# Patient Record
Sex: Female | Born: 1935 | Race: Black or African American | Hispanic: No | Smoking: Former smoker
Health system: Southern US, Community
[De-identification: ages and names within clinical notes are randomized; demographics above are authoritative.]

## PROBLEM LIST (undated history)

## (undated) DIAGNOSIS — I10 Essential (primary) hypertension: Secondary | ICD-10-CM

## (undated) DIAGNOSIS — F0390 Unspecified dementia without behavioral disturbance: Secondary | ICD-10-CM

## (undated) DIAGNOSIS — R011 Cardiac murmur, unspecified: Secondary | ICD-10-CM

---

## 1999-04-29 ENCOUNTER — Encounter: Payer: Self-pay | Admitting: Family Medicine

## 1999-04-29 ENCOUNTER — Encounter: Admission: RE | Admit: 1999-04-29 | Discharge: 1999-04-29 | Payer: Self-pay | Admitting: Family Medicine

## 2001-01-23 ENCOUNTER — Other Ambulatory Visit: Admission: RE | Admit: 2001-01-23 | Discharge: 2001-01-23 | Payer: Self-pay | Admitting: Family Medicine

## 2001-01-26 ENCOUNTER — Encounter: Payer: Self-pay | Admitting: Emergency Medicine

## 2001-01-26 ENCOUNTER — Inpatient Hospital Stay (HOSPITAL_COMMUNITY): Admission: EM | Admit: 2001-01-26 | Discharge: 2001-01-26 | Payer: Self-pay | Admitting: Emergency Medicine

## 2001-07-26 ENCOUNTER — Encounter: Payer: Self-pay | Admitting: Family Medicine

## 2001-07-26 ENCOUNTER — Encounter: Admission: RE | Admit: 2001-07-26 | Discharge: 2001-07-26 | Payer: Self-pay | Admitting: Family Medicine

## 2002-01-30 ENCOUNTER — Other Ambulatory Visit: Admission: RE | Admit: 2002-01-30 | Discharge: 2002-01-30 | Payer: Self-pay | Admitting: Family Medicine

## 2002-07-29 ENCOUNTER — Encounter: Payer: Self-pay | Admitting: Family Medicine

## 2002-07-29 ENCOUNTER — Encounter: Admission: RE | Admit: 2002-07-29 | Discharge: 2002-07-29 | Payer: Self-pay | Admitting: Family Medicine

## 2006-05-16 ENCOUNTER — Emergency Department (HOSPITAL_COMMUNITY): Admission: EM | Admit: 2006-05-16 | Discharge: 2006-05-16 | Payer: Self-pay | Admitting: Family Medicine

## 2007-11-15 ENCOUNTER — Ambulatory Visit: Payer: Self-pay | Admitting: Gastroenterology

## 2007-11-28 ENCOUNTER — Ambulatory Visit: Payer: Self-pay | Admitting: Gastroenterology

## 2010-08-27 NOTE — Op Note (Signed)
[place]  Patient:    Shannon Bright, Shannon Bright Visit Number: [id number] MRN: [id number]          Service Type: MED Location: 1800 1833 01 Attending Physician:  Marlowe Shores Dictated by:   Artist Pais Mina Marble, M.D. Proc. Date: 01/26/01 Admit Date:  01/26/2001 Discharge Date: 01/26/2001                             Operative Report  PREOPERATIVE DIAGNOSIS:  Right hand open fractures, index and ring fingers.  POSTOPERATIVE DIAGNOSIS:  Right hand open fractures, index and ring fingers.  PROCEDURES:  Irrigation debridement open fractures right index finger middle phalanx with pinning with 0.35 K-wire and 0.28 K-wire and also ORIF distal phalanx ring finger right with O.35 K-wire, as well as extensor tendon repair index and ring fingers with 4-0 Mersilene and primary closure with 5-0 nylon.  SURGEON:  Artist Pais. Mina Marble, M.D.  ASSISTANT:  None.  ANESTHESIA:  General.  TOURNIQUET TIME:  One hour 10 minutes.  COMPLICATIONS:  None.  DRAINS:  None.  DESCRIPTION:  Patient was taken to the operating room where after the induction of adequate general anesthesia the right upper extremity was prepped and draped in sterile fashion.  An Esmarch was used to exsanguinate the limb and tourniquet was inflated to 250 mmHg.  At this point in time, open fractures of the middle phalanx of the index finger and the DIP joint distal phalanx of the ring finger were approached.  Lacerations over the fractures were opened proximally and distally and extended.  The fracture sites were irrigated of clot, debris, and nonviable material.  After this was done, the middle phalangeal fracture of the index finger was pinned with two 0.28 K-wires in crossed fashion and the DIP joint over the ring finger was pinned with a single 0.35 K-wire across the DIP joint, thus reducing the large dorsal fragment.  At this point in time, the extensor tendons of both were repaired with  4-0 Mersilene followed by a skin closure with 5-0 nylon.  The patient then had Marcaine injected for postoperative pain control, placed in a sterile dressing with Xeroform, 4x4s, fluffs, and compressive dressing with a volar splint.  Patient tolerated procedure well and went to recovery room in stable fashion.Dictated by:   Artist Pais Mina Marble, M.D. Attending Physician:  Marlowe Shores DD:  01/26/01 TD:  01/27/01 Job: [id number]

## 2010-08-27 NOTE — Consult Note (Signed)
[place]  Patient:    Shannon Bright, Shannon Bright Visit Number: [id number] MRN: [id number]          Service Type: MED Location: 1800 1833 01 Attending Physician:  Marlowe Shores Dictated by:   Artist Pais Mina Marble, M.D. Proc. Date: 01/26/01 Admit Date:  01/26/2001 Discharge Date: 01/26/2001                            Consultation Report  REASON FOR CONSULTATION:  Right hand crush type injury.  HISTORY OF PRESENT ILLNESS:  The patient is a 77 year old white female, right-hand dominant who was injured at work today, sustaining injury to her right hand with open fractures of the index and ring fingers.  She is an otherwise healthy 77 year old, right-hand dominant female.  ALLERGIES:  No known drug allergies.  MEDICATIONS:  She is currently on verapamil for hypertension.  FAMILY HISTORY:  She has a significant family medical history for hypertension and diabetes.  PAST MEDICAL HISTORY:  She has had no recent hospitalizations for surgery.  REVIEW OF SYSTEMS:  Noncontributory.  SOCIAL HISTORY:  Noncontributory.  PHYSICAL EXAMINATION:  GENERAL:  Well-developed, well-nourished female, pleasant, alert, and oriented x 3.  EXTREMITIES:  On examination of her right hand, she has transverse laceration dorsally to the middle phalanx, loss of active extension, and exposed bone consistent with open fracture of the middle phalanx.  She also has a small laceration on the ulnar side at the distal interphalangeal joint of the ring finger with exposed bone consistent with open fracture distal phalanx ring finger.  LABORATORY DATA:  X-rays show the above fractures.  She is otherwise neurovascularly intact.  Medial, radial, and ulnar nerves are functioning.  IMPRESSION:  A 77 year old female with open injuries to her right hand including open fractures to index finger middle phalanx and ring finger distal phalanx.  PLAN:  At this point in time, take her  to the OR for irrigation and debridement and pinning of the above fractures. Dictated by:   Artist Pais Mina Marble, M.D. Attending Physician:  Marlowe Shores DD:  01/26/01 TD:  01/28/01 Job: [id number]

## 2011-01-15 ENCOUNTER — Inpatient Hospital Stay (INDEPENDENT_AMBULATORY_CARE_PROVIDER_SITE_OTHER)
Admission: RE | Admit: 2011-01-15 | Discharge: 2011-01-15 | Disposition: A | Discharge status: Home / Self Care | Payer: Medicare Other | Source: Ambulatory Visit | Attending: Emergency Medicine | Admitting: Emergency Medicine

## 2011-01-15 DIAGNOSIS — M5430 Sciatica, unspecified side: Secondary | ICD-10-CM

## 2012-03-15 ENCOUNTER — Other Ambulatory Visit (HOSPITAL_COMMUNITY): Payer: Self-pay | Admitting: Internal Medicine

## 2012-03-15 DIAGNOSIS — I6529 Occlusion and stenosis of unspecified carotid artery: Secondary | ICD-10-CM

## 2012-04-02 ENCOUNTER — Encounter (HOSPITAL_COMMUNITY): Payer: Medicare Other

## 2012-04-02 ENCOUNTER — Ambulatory Visit (HOSPITAL_COMMUNITY)
Admission: RE | Admit: 2012-04-02 | Discharge: 2012-04-02 | Disposition: A | Discharge status: Home / Self Care | Payer: No Typology Code available for payment source | Source: Ambulatory Visit | Attending: Internal Medicine | Admitting: Internal Medicine

## 2012-04-02 DIAGNOSIS — I6529 Occlusion and stenosis of unspecified carotid artery: Secondary | ICD-10-CM | POA: Insufficient documentation

## 2012-04-03 NOTE — Progress Notes (Signed)
Carotid duplex completed. Pearson Grippe

## 2013-03-11 ENCOUNTER — Encounter (HOSPITAL_COMMUNITY): Payer: Self-pay | Admitting: Emergency Medicine

## 2013-03-11 ENCOUNTER — Emergency Department (HOSPITAL_COMMUNITY)
Admission: EM | Admit: 2013-03-11 | Discharge: 2013-03-11 | Disposition: A | Discharge status: Home / Self Care | Payer: No Typology Code available for payment source | Attending: Emergency Medicine | Admitting: Emergency Medicine

## 2013-03-11 ENCOUNTER — Emergency Department (HOSPITAL_COMMUNITY): Payer: No Typology Code available for payment source

## 2013-03-11 DIAGNOSIS — Z87891 Personal history of nicotine dependence: Secondary | ICD-10-CM | POA: Insufficient documentation

## 2013-03-11 DIAGNOSIS — I10 Essential (primary) hypertension: Secondary | ICD-10-CM | POA: Insufficient documentation

## 2013-03-11 DIAGNOSIS — Z79899 Other long term (current) drug therapy: Secondary | ICD-10-CM | POA: Insufficient documentation

## 2013-03-11 DIAGNOSIS — R002 Palpitations: Secondary | ICD-10-CM

## 2013-03-11 DIAGNOSIS — F0390 Unspecified dementia without behavioral disturbance: Secondary | ICD-10-CM | POA: Insufficient documentation

## 2013-03-11 HISTORY — DX: Essential (primary) hypertension: I10

## 2013-03-11 HISTORY — DX: Unspecified dementia, unspecified severity, without behavioral disturbance, psychotic disturbance, mood disturbance, and anxiety: F03.90

## 2013-03-11 LAB — BASIC METABOLIC PANEL
CO2: 30 mEq/L (ref 19–32)
Chloride: 100 mEq/L (ref 96–112)
Creatinine, Ser: 0.69 mg/dL (ref 0.50–1.10)
GFR calc Af Amer: >90 mL/min (ref >90)
Potassium: 3.2 mEq/L — ABNORMAL LOW (ref 3.5–5.1)

## 2013-03-11 LAB — CBC
MCV: 82 fL (ref 78.0–100.0)
Platelets: 254 10*3/uL (ref 150–400)
RBC: 4.67 MIL/uL (ref 3.87–5.11)
WBC: 4.9 10*3/uL (ref 4.0–10.5)

## 2013-03-11 LAB — POCT I-STAT TROPONIN I: Troponin i, poc: 0 ng/mL (ref 0.00–0.08)

## 2013-03-11 MED ORDER — LORAZEPAM 2 MG/ML IJ SOLN: 1.0000 mg | Freq: Once | INTRAMUSCULAR | Status: DC

## 2013-03-11 MED ORDER — POTASSIUM CHLORIDE CRYS ER 20 MEQ PO TBCR
40.0000 meq | EXTENDED_RELEASE_TABLET | Freq: Once | ORAL | Status: AC
  Administered 2013-03-11: 40 meq via ORAL
  Filled 2013-03-11: qty 2

## 2013-03-11 NOTE — ED Notes (Signed)
Pt is here with fluttering in chest that is new and has a knot feeling to mid abdomen area.  No chest tightness or sob

## 2013-03-11 NOTE — ED Provider Notes (Signed)
CSN: [id number]     Arrival date & time 03/11/13  1001 History   First MD Initiated Contact with Patient 03/11/13 1327     Chief Complaint  Patient presents with  . Palpitations    HPI Patient presents to emergency room with complaints of fluttering sensation in her chest off and on for the last month. Patient has a history of dementia so she has some difficulty remembering the exact details. She thinks the symptoms have been off and on for about a month. The episodes last maybe a few minutes at a time. She has a sensation of her heart beating irregularly and when this occurs she feels a sensation in her mid abdomen of a or tightness.  She has not had any any symptoms in the last 24 hours. She had an episode over this weekend and mentioned it to her daughter and that is why she is here in the emergency room today. She denies any trouble with chest pain or shortness of breath. She has not had any vomiting or diarrhea. She has not had any light headedness or syncope.  Past Medical History  Diagnosis Date  . Hypertension   . Dementia    History reviewed. No pertinent past surgical history. No family history on file. History  Substance Use Topics  . Smoking status: Former Games developer  . Smokeless tobacco: Not on file  . Alcohol Use: No   OB History   Grav Para Term Preterm Abortions TAB SAB Ect Mult Living                 Review of Systems  All other systems reviewed and are negative.    Allergies  Review of patient's allergies indicates no known allergies.  Home Medications   Current Outpatient Rx  Name  Route  Sig  Dispense  Refill  . donepezil (ARICEPT) 10 MG tablet   Oral   Take 10 mg by mouth at bedtime.         . dorzolamide-timolol (COSOPT) 22.3-6.8 MG/ML ophthalmic solution   Left Eye   Place 1 drop into the left eye 2 (two) times daily.         Marland Kitchen latanoprost (XALATAN) 0.005 % ophthalmic solution   Left Eye   Place 1 drop into the left eye at bedtime.          Marland Kitchen losartan-hydrochlorothiazide (HYZAAR) 100-25 MG per tablet   Oral   Take 1 tablet by mouth daily.         . potassium chloride (K-DUR) 10 MEQ tablet   Oral   Take 10 mEq by mouth 2 (two) times daily.         . ranitidine (ZANTAC) 150 MG tablet   Oral   Take 150 mg by mouth 2 (two) times daily.         . simvastatin (ZOCOR) 20 MG tablet   Oral   Take 20 mg by mouth daily.          BP 146/60  Pulse 55  Temp(Src) 97.5 F (36.4 C) (Oral)  Resp 18  Wt 109 lb (49.442 kg)  SpO2 100% Physical Exam  Nursing note and vitals reviewed. Constitutional: She appears well-developed and well-nourished. No distress.  HENT:  Head: Normocephalic and atraumatic.  Right Ear: External ear normal.  Left Ear: External ear normal.  Eyes: Conjunctivae are normal. Right eye exhibits no discharge. Left eye exhibits no discharge. No scleral icterus.  Neck: Neck supple. No tracheal deviation present.  Cardiovascular: Normal rate, regular rhythm and intact distal pulses.   Pulmonary/Chest: Effort normal and breath sounds normal. No stridor. No respiratory distress. She has no wheezes. She has no rales.  Abdominal: Soft. Bowel sounds are normal. She exhibits no distension. There is no tenderness. There is no rebound and no guarding.  Musculoskeletal: She exhibits no edema and no tenderness.  Neurological: She is alert. She has normal strength. No sensory deficit. Cranial nerve deficit:  no gross defecits noted. She exhibits normal muscle tone. She displays no seizure activity. Coordination normal.  Skin: Skin is warm and dry. No rash noted.  Psychiatric: She has a normal mood and affect.    ED Course  Procedures (including critical care time) Labs Review Labs Reviewed  BASIC METABOLIC PANEL - Abnormal; Notable for the following:    Potassium 3.2 (*)    Glucose, Bld 103 (*)    GFR calc non Af Amer 81 (*)    All other components within normal limits  CBC  POCT I-STAT TROPONIN I    Imaging Review Dg Chest 2 View  03/11/2013   CLINICAL DATA:  Palpitation.  EXAM: CHEST  2 VIEW  COMPARISON:  None.  FINDINGS: The heart size and mediastinal contours are within normal limits. The aorta is tortuous. Both lungs are clear. There are degenerative joint changes of the spine.  IMPRESSION: No active cardiopulmonary disease.   Electronically Signed   By: Sherian Rein M.D.   On: 03/11/2013 11:04    EKG Interpretation    Date/Time:  Monday March 11 2013 10:06:49 EST Ventricular Rate:  72 PR Interval:  152 QRS Duration: 90 QT Interval:  414 QTC Calculation: 453 R Axis:   45 Text Interpretation:  Normal sinus rhythm Minimal voltage criteria for LVH, may be normal variant Nonspecific ST and T wave abnormality Abnormal ECG No significant change since last tracing Confirmed by Chaston Bradburn  MD-J, Jaskirat Schwieger ([id number]) on 03/11/2013 1:27:25 PM           Medications  potassium chloride SA (K-DUR,KLOR-CON) CR tablet 40 mEq (not administered)    MDM   1. Palpitations    Normal heart rhythm in the ED.  No ectopy noted on the monitor.  Mild hypokalemia.  Oral dose given in the ED.  Follow up with pcp to discuss possible holter monitoring.  At this time there does not appear to be any evidence of an acute emergency medical condition and the patient appears stable for discharge with appropriate outpatient follow up.     Celene Kras, MD 03/11/13 [phone]

## 2013-07-03 ENCOUNTER — Other Ambulatory Visit: Payer: Self-pay | Admitting: Internal Medicine

## 2013-07-03 ENCOUNTER — Ambulatory Visit
Admission: RE | Admit: 2013-07-03 | Discharge: 2013-07-03 | Disposition: A | Discharge status: Home / Self Care | Payer: Commercial Managed Care - HMO | Source: Ambulatory Visit | Attending: Internal Medicine | Admitting: Internal Medicine

## 2013-07-03 DIAGNOSIS — M25579 Pain in unspecified ankle and joints of unspecified foot: Secondary | ICD-10-CM

## 2013-10-09 ENCOUNTER — Other Ambulatory Visit: Payer: Self-pay | Admitting: Internal Medicine

## 2013-10-09 DIAGNOSIS — E2839 Other primary ovarian failure: Secondary | ICD-10-CM

## 2013-10-10 ENCOUNTER — Ambulatory Visit
Admission: RE | Admit: 2013-10-10 | Discharge: 2013-10-10 | Disposition: A | Discharge status: Home / Self Care | Payer: Commercial Managed Care - HMO | Source: Ambulatory Visit | Attending: Internal Medicine | Admitting: Internal Medicine

## 2013-10-10 DIAGNOSIS — E2839 Other primary ovarian failure: Secondary | ICD-10-CM

## 2014-10-20 ENCOUNTER — Encounter: Payer: Self-pay | Admitting: Gastroenterology

## 2016-11-11 ENCOUNTER — Encounter (HOSPITAL_COMMUNITY): Payer: Self-pay | Admitting: *Deleted

## 2016-11-11 ENCOUNTER — Emergency Department (HOSPITAL_COMMUNITY)
Admission: EM | Admit: 2016-11-11 | Discharge: 2016-11-11 | Disposition: A | Discharge status: Home / Self Care | Payer: Medicare (Managed Care) | Attending: Emergency Medicine | Admitting: Emergency Medicine

## 2016-11-11 DIAGNOSIS — Z79899 Other long term (current) drug therapy: Secondary | ICD-10-CM | POA: Insufficient documentation

## 2016-11-11 DIAGNOSIS — Z7982 Long term (current) use of aspirin: Secondary | ICD-10-CM | POA: Diagnosis not present

## 2016-11-11 DIAGNOSIS — Z87891 Personal history of nicotine dependence: Secondary | ICD-10-CM | POA: Insufficient documentation

## 2016-11-11 DIAGNOSIS — I10 Essential (primary) hypertension: Secondary | ICD-10-CM | POA: Diagnosis not present

## 2016-11-11 DIAGNOSIS — R410 Disorientation, unspecified: Secondary | ICD-10-CM | POA: Diagnosis not present

## 2016-11-11 DIAGNOSIS — R4182 Altered mental status, unspecified: Secondary | ICD-10-CM | POA: Diagnosis present

## 2016-11-11 LAB — I-STAT CHEM 8, ED
BUN: 16 mg/dL (ref 6–20)
CALCIUM ION: 1.14 mmol/L — AB (ref 1.15–1.40)
CHLORIDE: 101 mmol/L (ref 101–111)
CREATININE: 1 mg/dL (ref 0.44–1.00)
GLUCOSE: 181 mg/dL — AB (ref 65–99)
HCT: 37 % (ref 36.0–46.0)
Hemoglobin: 12.6 g/dL (ref 12.0–15.0)
Potassium: 3.1 mmol/L — ABNORMAL LOW (ref 3.5–5.1)
Sodium: 142 mmol/L (ref 135–145)
TCO2: 28 mmol/L (ref 0–100)

## 2016-11-11 MED ORDER — POTASSIUM CHLORIDE CRYS ER 20 MEQ PO TBCR
40.0000 meq | EXTENDED_RELEASE_TABLET | Freq: Once | ORAL | Status: AC
  Administered 2016-11-11: 40 meq via ORAL
  Filled 2016-11-11: qty 2

## 2016-11-11 NOTE — ED Provider Notes (Signed)
[facility]-EMERGENCY DEPT Provider Note   CSN: [id number] Arrival date & time: 11/11/16  1501     History   Chief Complaint Chief Complaint  Patient presents with  . Altered Mental Status    HPI Shannon Bright is a 83 y.o. female.  Patient brought over from adult day care because she was more confused than normal and had low blood pressure. On my initial evaluation her daughter states that she is her normal self.   The history is provided by a relative. No language interpreter was used.  Altered Mental Status   This is a recurrent problem. The current episode started more than 1 week ago. The problem has not changed since onset.Pertinent negatives include no agitation and no self-injury. Risk factors: History of dementia. Her past medical history does not include seizures.    Past Medical History:  Diagnosis Date  . Dementia   . Hypertension     There are no active problems to display for this patient.   History reviewed. No pertinent surgical history.  OB History    No data available       Home Medications    Prior to Admission medications   Medication Sig Start Date End Date Taking? Authorizing Provider  acetaminophen (TYLENOL) 325 MG tablet Take 650 mg by mouth every 6 (six) hours as needed for mild pain.   Yes [provider]  amLODipine (NORVASC) 10 MG tablet Take 10 mg by mouth daily.   Yes [provider]  aspirin EC 81 MG tablet Take 81 mg by mouth daily.   Yes [provider]  ciprofloxacin (CIPRO) 250 MG tablet Take 250 mg by mouth 2 (two) times daily. For 7 days; started on 11-09-16   Yes [provider]  donepezil (ARICEPT) 10 MG tablet Take 10 mg by mouth at bedtime.   Yes [provider]  dorzolamide-timolol (COSOPT) 22.3-6.8 MG/ML ophthalmic solution Place 1 drop into the left eye 2 (two) times daily.   Yes [provider]  latanoprost (XALATAN) 0.005 % ophthalmic solution Place 1 drop into the left eye  at bedtime.   Yes [provider]  losartan-hydrochlorothiazide (HYZAAR) 100-12.5 MG tablet Take 1 tablet by mouth daily.   Yes [provider]  Melatonin 1 MG TABS Take 1 mg by mouth at bedtime.   Yes [provider]  memantine (NAMENDA) 10 MG tablet Take 10 mg by mouth 2 (two) times daily.   Yes [provider]  potassium chloride (K-DUR) 10 MEQ tablet Take 20 mEq by mouth daily.    Yes [provider]  ranitidine (ZANTAC) 150 MG tablet Take 150 mg by mouth 2 (two) times daily.   Yes [provider]  risperiDONE (RISPERDAL) 0.25 MG tablet Take 0.25 mg by mouth at bedtime.   Yes [provider]  simvastatin (ZOCOR) 20 MG tablet Take 20 mg by mouth daily.   Yes [provider]  losartan-hydrochlorothiazide (HYZAAR) 100-25 MG per tablet Take 1 tablet by mouth daily.    [provider]    Family History No family history on file.  Social History Social History  Substance Use Topics  . Smoking status: Former Games developermoker  . Smokeless tobacco: Never Used  . Alcohol use No     Allergies   Patient has no known allergies.   Review of Systems Review of Systems  Unable to perform ROS: Dementia  Psychiatric/Behavioral: Negative for agitation and self-injury.     Physical Exam Updated Vital  Signs BP 120/87   Pulse (!) 59   Temp (!) 97.1 F (36.2 C) (Axillary)   Resp 15   SpO2 100%   Physical Exam  Constitutional: She appears well-developed.  HENT:  Head: Normocephalic.  Eyes: Conjunctivae and EOM are normal. No scleral icterus.  Neck: Neck supple. No thyromegaly present.  Cardiovascular: Normal rate and regular rhythm.  Exam reveals no gallop and no friction rub.   No murmur heard. Pulmonary/Chest: No stridor. She has no wheezes. She has no rales. She exhibits no tenderness.  Abdominal: She exhibits no distension. There is no tenderness. There is no rebound.  Musculoskeletal: Normal range of motion.  She exhibits no edema.  Lymphadenopathy:    She has no cervical adenopathy.  Neurological: She is alert. She exhibits normal muscle tone. Coordination normal.  Oriented to person only  Skin: No rash noted. No erythema.  Psychiatric: She has a normal mood and affect. Her behavior is normal.     ED Treatments / Results  Labs (all labs ordered are listed, but only abnormal results are displayed) Labs Reviewed  I-STAT CHEM 8, ED - Abnormal; Notable for the following:       Result Value   Potassium 3.1 (*)    Glucose, Bld 181 (*)    Calcium, Ion 1.14 (*)    All other components within normal limits    EKG  EKG Interpretation None       Radiology No results found.  Procedures Procedures (including critical care time)  Medications Ordered in ED Medications  potassium chloride SA (K-DUR,KLOR-CON) CR tablet 40 mEq (not administered)     Initial Impression / Assessment and Plan / ED Course  I have reviewed the triage vital signs and the nursing notes.  Pertinent labs & imaging results that were available during my care of the patient were reviewed by me and considered in my medical decision making (see chart for details).     Patient's blood pressure has been normal in the emergency department. Her daughter states that she is at her baseline. Patient will be discharged home and follow-up with her doctor  Final Clinical Impressions(s) / ED Diagnoses   Final diagnoses:  Confusion    New Prescriptions New Prescriptions   No medications on file     Bethann BerkshireZammit, Rodman Recupero, MD 11/11/16 [phone]

## 2016-11-11 NOTE — ED Notes (Signed)
Pt's daughter reports pt was doing fine this am, ate her breakfast and did not have any complaints.

## 2016-11-11 NOTE — Discharge Instructions (Signed)
Follow-up with your doctor if any problems 

## 2016-11-11 NOTE — ED Triage Notes (Signed)
Per EMS, pt transported from [facility] today d/t AMS with hypotension and bradycardia.  Has advanced dementia.  Pt alert to self.  Pt is c/o L shoulder pain.  Staff at [facility] reports pt was more lethargic today, her BP was 80/50 and pulse was 56.

## 2016-12-13 ENCOUNTER — Ambulatory Visit (INDEPENDENT_AMBULATORY_CARE_PROVIDER_SITE_OTHER): Payer: Medicare (Managed Care) | Admitting: Orthopedic Surgery

## 2016-12-13 ENCOUNTER — Inpatient Hospital Stay (HOSPITAL_COMMUNITY)
Admission: EM | Admit: 2016-12-13 | Discharge: 2016-12-19 | DRG: 470 | Disposition: A | Discharge status: Skilled Nursing Facility | Payer: Medicare (Managed Care) | Attending: Internal Medicine | Admitting: Internal Medicine

## 2016-12-13 ENCOUNTER — Emergency Department (HOSPITAL_COMMUNITY): Payer: Medicare (Managed Care)

## 2016-12-13 ENCOUNTER — Other Ambulatory Visit: Payer: Self-pay | Admitting: Nurse Practitioner

## 2016-12-13 ENCOUNTER — Encounter (INDEPENDENT_AMBULATORY_CARE_PROVIDER_SITE_OTHER): Payer: Self-pay | Admitting: Orthopedic Surgery

## 2016-12-13 ENCOUNTER — Encounter (HOSPITAL_COMMUNITY): Payer: Self-pay | Admitting: Emergency Medicine

## 2016-12-13 ENCOUNTER — Ambulatory Visit
Admission: RE | Admit: 2016-12-13 | Discharge: 2016-12-13 | Disposition: A | Discharge status: Home / Self Care | Payer: Medicare (Managed Care) | Source: Ambulatory Visit | Attending: Nurse Practitioner | Admitting: Nurse Practitioner

## 2016-12-13 DIAGNOSIS — D62 Acute posthemorrhagic anemia: Secondary | ICD-10-CM | POA: Diagnosis not present

## 2016-12-13 DIAGNOSIS — Z7982 Long term (current) use of aspirin: Secondary | ICD-10-CM | POA: Diagnosis not present

## 2016-12-13 DIAGNOSIS — F0390 Unspecified dementia without behavioral disturbance: Secondary | ICD-10-CM | POA: Diagnosis present

## 2016-12-13 DIAGNOSIS — Z9181 History of falling: Secondary | ICD-10-CM | POA: Diagnosis not present

## 2016-12-13 DIAGNOSIS — S72001A Fracture of unspecified part of neck of right femur, initial encounter for closed fracture: Secondary | ICD-10-CM | POA: Diagnosis present

## 2016-12-13 DIAGNOSIS — M80051A Age-related osteoporosis with current pathological fracture, right femur, initial encounter for fracture: Secondary | ICD-10-CM | POA: Diagnosis present

## 2016-12-13 DIAGNOSIS — H409 Unspecified glaucoma: Secondary | ICD-10-CM | POA: Diagnosis present

## 2016-12-13 DIAGNOSIS — M25551 Pain in right hip: Secondary | ICD-10-CM

## 2016-12-13 DIAGNOSIS — Z419 Encounter for procedure for purposes other than remedying health state, unspecified: Secondary | ICD-10-CM

## 2016-12-13 DIAGNOSIS — Z96649 Presence of unspecified artificial hip joint: Secondary | ICD-10-CM

## 2016-12-13 DIAGNOSIS — M80051D Age-related osteoporosis with current pathological fracture, right femur, subsequent encounter for fracture with routine healing: Secondary | ICD-10-CM | POA: Diagnosis not present

## 2016-12-13 DIAGNOSIS — E876 Hypokalemia: Secondary | ICD-10-CM | POA: Diagnosis present

## 2016-12-13 DIAGNOSIS — G301 Alzheimer's disease with late onset: Secondary | ICD-10-CM | POA: Diagnosis not present

## 2016-12-13 DIAGNOSIS — I10 Essential (primary) hypertension: Secondary | ICD-10-CM | POA: Diagnosis present

## 2016-12-13 DIAGNOSIS — S72009A Fracture of unspecified part of neck of unspecified femur, initial encounter for closed fracture: Secondary | ICD-10-CM | POA: Diagnosis present

## 2016-12-13 DIAGNOSIS — Z79899 Other long term (current) drug therapy: Secondary | ICD-10-CM | POA: Diagnosis not present

## 2016-12-13 DIAGNOSIS — Z96641 Presence of right artificial hip joint: Secondary | ICD-10-CM | POA: Diagnosis not present

## 2016-12-13 DIAGNOSIS — F0281 Dementia in other diseases classified elsewhere with behavioral disturbance: Secondary | ICD-10-CM | POA: Diagnosis not present

## 2016-12-13 DIAGNOSIS — Z87891 Personal history of nicotine dependence: Secondary | ICD-10-CM | POA: Diagnosis not present

## 2016-12-13 DIAGNOSIS — F02818 Dementia in other diseases classified elsewhere, unspecified severity, with other behavioral disturbance: Secondary | ICD-10-CM

## 2016-12-13 DIAGNOSIS — W010XXA Fall on same level from slipping, tripping and stumbling without subsequent striking against object, initial encounter: Secondary | ICD-10-CM | POA: Diagnosis present

## 2016-12-13 DIAGNOSIS — M810 Age-related osteoporosis without current pathological fracture: Secondary | ICD-10-CM | POA: Diagnosis present

## 2016-12-13 DIAGNOSIS — I350 Nonrheumatic aortic (valve) stenosis: Secondary | ICD-10-CM | POA: Diagnosis present

## 2016-12-13 DIAGNOSIS — R011 Cardiac murmur, unspecified: Secondary | ICD-10-CM | POA: Diagnosis not present

## 2016-12-13 HISTORY — DX: Cardiac murmur, unspecified: R01.1

## 2016-12-13 LAB — COMPREHENSIVE METABOLIC PANEL
ALBUMIN: 3.7 g/dL (ref 3.5–5.0)
ALK PHOS: 45 U/L (ref 38–126)
ALT: 15 U/L (ref 14–54)
AST: 17 U/L (ref 15–41)
Anion gap: 11 (ref 5–15)
BILIRUBIN TOTAL: 0.7 mg/dL (ref 0.3–1.2)
BUN: 18 mg/dL (ref 6–20)
CALCIUM: 9.6 mg/dL (ref 8.9–10.3)
CO2: 26 mmol/L (ref 22–32)
Chloride: 105 mmol/L (ref 101–111)
Creatinine, Ser: 0.86 mg/dL (ref 0.44–1.00)
GFR calc Af Amer: >60 mL/min (ref >60)
GFR calc non Af Amer: >60 mL/min (ref >60)
GLUCOSE: 137 mg/dL — AB (ref 65–99)
Potassium: 3.4 mmol/L — ABNORMAL LOW (ref 3.5–5.1)
Sodium: 142 mmol/L (ref 135–145)
TOTAL PROTEIN: 7.4 g/dL (ref 6.5–8.1)

## 2016-12-13 LAB — URINALYSIS, ROUTINE W REFLEX MICROSCOPIC
BACTERIA UA: NONE SEEN
BILIRUBIN URINE: NEGATIVE
Glucose, UA: 150 mg/dL — AB
Hgb urine dipstick: NEGATIVE
KETONES UR: NEGATIVE mg/dL
Nitrite: NEGATIVE
PROTEIN: 30 mg/dL — AB
Specific Gravity, Urine: 1.019 (ref 1.005–1.030)
pH: 5 (ref 5.0–8.0)

## 2016-12-13 LAB — CBC WITH DIFFERENTIAL/PLATELET
BASOS ABS: 0 10*3/uL (ref 0.0–0.1)
BASOS PCT: 0 %
Eosinophils Absolute: 0.1 10*3/uL (ref 0.0–0.7)
Eosinophils Relative: 1 %
HEMATOCRIT: 34.8 % — AB (ref 36.0–46.0)
HEMOGLOBIN: 11.1 g/dL — AB (ref 12.0–15.0)
Lymphocytes Relative: 10 %
Lymphs Abs: 1.1 10*3/uL (ref 0.7–4.0)
MCH: 24.7 pg — ABNORMAL LOW (ref 26.0–34.0)
MCHC: 31.9 g/dL (ref 30.0–36.0)
MCV: 77.5 fL — ABNORMAL LOW (ref 78.0–100.0)
Monocytes Absolute: 0.8 10*3/uL (ref 0.1–1.0)
Monocytes Relative: 8 %
NEUTROS ABS: 8.5 10*3/uL — AB (ref 1.7–7.7)
NEUTROS PCT: 81 %
Platelets: 279 10*3/uL (ref 150–400)
RBC: 4.49 MIL/uL (ref 3.87–5.11)
RDW: 15.6 % — ABNORMAL HIGH (ref 11.5–15.5)
WBC: 10.4 10*3/uL (ref 4.0–10.5)

## 2016-12-13 MED ORDER — SENNOSIDES-DOCUSATE SODIUM 8.6-50 MG PO TABS
1.0000 | ORAL_TABLET | Freq: Every evening | ORAL | Status: DC | PRN
  Administered 2016-12-17: 1 via ORAL
  Filled 2016-12-13: qty 1

## 2016-12-13 MED ORDER — LOSARTAN POTASSIUM 50 MG PO TABS
100.0000 mg | ORAL_TABLET | Freq: Every day | ORAL | Status: DC
  Administered 2016-12-14 – 2016-12-19 (×5): 100 mg via ORAL
  Filled 2016-12-13 (×6): qty 2

## 2016-12-13 MED ORDER — MEMANTINE HCL 10 MG PO TABS
10.0000 mg | ORAL_TABLET | Freq: Two times a day (BID) | ORAL | Status: DC
  Administered 2016-12-13 – 2016-12-19 (×11): 10 mg via ORAL
  Filled 2016-12-13 (×12): qty 1

## 2016-12-13 MED ORDER — POTASSIUM CHLORIDE CRYS ER 20 MEQ PO TBCR
20.0000 meq | EXTENDED_RELEASE_TABLET | Freq: Every day | ORAL | Status: DC
  Administered 2016-12-14 – 2016-12-16 (×2): 20 meq via ORAL
  Filled 2016-12-13: qty 2
  Filled 2016-12-13 (×3): qty 1

## 2016-12-13 MED ORDER — LOSARTAN POTASSIUM-HCTZ 100-12.5 MG PO TABS: 1.0000 | ORAL_TABLET | Freq: Every day | ORAL | Status: DC

## 2016-12-13 MED ORDER — DORZOLAMIDE HCL-TIMOLOL MAL 2-0.5 % OP SOLN
1.0000 [drp] | Freq: Two times a day (BID) | OPHTHALMIC | Status: DC
  Administered 2016-12-13 – 2016-12-19 (×11): 1 [drp] via OPHTHALMIC
  Filled 2016-12-13: qty 10

## 2016-12-13 MED ORDER — LATANOPROST 0.005 % OP SOLN
1.0000 [drp] | Freq: Every day | OPHTHALMIC | Status: DC
  Administered 2016-12-13 – 2016-12-18 (×6): 1 [drp] via OPHTHALMIC
  Filled 2016-12-13: qty 2.5

## 2016-12-13 MED ORDER — DONEPEZIL HCL 10 MG PO TABS
10.0000 mg | ORAL_TABLET | Freq: Every day | ORAL | Status: DC
  Administered 2016-12-13 – 2016-12-18 (×6): 10 mg via ORAL
  Filled 2016-12-13 (×6): qty 1

## 2016-12-13 MED ORDER — SIMVASTATIN 20 MG PO TABS
20.0000 mg | ORAL_TABLET | Freq: Every day | ORAL | Status: DC
  Administered 2016-12-14 – 2016-12-19 (×5): 20 mg via ORAL
  Filled 2016-12-13 (×6): qty 1

## 2016-12-13 MED ORDER — AMLODIPINE BESYLATE 10 MG PO TABS
10.0000 mg | ORAL_TABLET | Freq: Every day | ORAL | Status: DC
Start: 2016-12-14 — End: 2016-12-19
  Administered 2016-12-14 – 2016-12-19 (×5): 10 mg via ORAL
  Filled 2016-12-13 (×6): qty 1

## 2016-12-13 MED ORDER — RISPERIDONE 0.5 MG PO TABS
0.2500 mg | ORAL_TABLET | Freq: Every day | ORAL | Status: DC
  Administered 2016-12-13 – 2016-12-18 (×6): 0.25 mg via ORAL
  Filled 2016-12-13 (×6): qty 1

## 2016-12-13 MED ORDER — MELATONIN 3 MG PO TABS
3.0000 mg | ORAL_TABLET | Freq: Every day | ORAL | Status: DC
  Administered 2016-12-14 – 2016-12-18 (×5): 3 mg via ORAL
  Filled 2016-12-13 (×6): qty 1

## 2016-12-13 MED ORDER — HYDROCHLOROTHIAZIDE 12.5 MG PO CAPS
12.5000 mg | ORAL_CAPSULE | Freq: Every day | ORAL | Status: DC
  Administered 2016-12-14 – 2016-12-19 (×5): 12.5 mg via ORAL
  Filled 2016-12-13 (×6): qty 1

## 2016-12-13 NOTE — ED Triage Notes (Signed)
Per EMS: Pt tripped and fell last night at home with c/o of right hip pain.  [facility] and was sent here to be evaluated for surgery due to a possible hip Fx.  Pt denies LOC.  [facility] Pt has no pre-existing injury to the right hip.  PTA vitals: 160/90, HR 76, RR 18, Sp02 96 RA.

## 2016-12-13 NOTE — ED Notes (Signed)
XR at bedside

## 2016-12-13 NOTE — Progress Notes (Signed)
I saw the patient in my office today at the request of Dr. Lajoyce Cornersuda.  I will assume the patient's orthopedic care.  Surgery is planned for tomorrow.  NPO after midnight.

## 2016-12-13 NOTE — ED Notes (Signed)
Provider at the bedside.  

## 2016-12-13 NOTE — Progress Notes (Signed)
Present to place PIV.  Pt in transit to floor.  Will follow up

## 2016-12-13 NOTE — ED Notes (Signed)
Per floor 5N: They are not able to accept this pt due to staffing

## 2016-12-13 NOTE — ED Notes (Signed)
Attempted report 

## 2016-12-13 NOTE — Progress Notes (Signed)
   Office Visit Note   Patient: Shannon Bright           Date of Birth: 05/24/1931           MRN: [id number] Visit Date: 12/13/2016              Requested by: No referring provider defined for this encounter. PCP: System, Pcp Not In  Chief Complaint  Patient presents with  . Right Hip - Fracture      HPI: Patient is an 83 year old woman with Alzheimer's dementia who lives at home with family. [facility].  Patient lost her balance last night fell on the right hip. [facility] shows a displaced femoral neck fracture on the right.  Assessment & Plan: Visit Diagnoses:  1. Closed displaced fracture of right femoral neck (HCC)   2. Late onset Alzheimer's disease with behavioral disturbance     Plan: Will have patient admitted to the hospitalist service today plan for surgery tomorrow with Dr. Leigh AuroraX Yoo. Discussed with the patient's family the risks of infection dislocation need for additional surgery. Discussed that with her dementia she may not be able to walk again.  Follow-Up Instructions: Return if symptoms worsen or fail to improve.   Ortho Exam  Patient is not alert, nor oriented, no adenopathy, well-dressed, abnormal affect, normal respiratory effort. Patient does not respond to commands. Patient has a palpable dorsalis pedis pulse. She has pain with attempted internal and external rotation of the right hip. Radiographs shows significant vascular disease with calcification of the femoral vessels arthritic changes of the acetabulum as well as an impacted femoral neck fracture.  Imaging: Dg Hip Unilat With Pelvis 2-3 Views Right  Result Date: 12/13/2016 CLINICAL DATA:  Larey SeatFell 12/12/2016.  Persistent pain. EXAM: DG HIP (WITH OR WITHOUT PELVIS) 2-3V RIGHT COMPARISON:  None. FINDINGS: Impacted subcapital fracture of the right hip. Moderate right hip joint degenerative changes. The visualized right hemipelvis is  intact. The pubic symphysis and SI joints are intact. Extensive vascular calcifications are noted. IMPRESSION: Impacted subcapital fracture of the right hip. Electronically Signed   By: Rudie MeyerP.  Gallerani M.D.   On: 12/13/2016 11:39   No images are attached to the encounter.  Labs: No results found for: HGBA1C, ESRSEDRATE, CRP, LABURIC, REPTSTATUS, GRAMSTAIN, CULT, LABORGA  Orders:  No orders of the defined types were placed in this encounter.  No orders of the defined types were placed in this encounter.    Procedures: No procedures performed  Clinical Data: No additional findings.  ROS:  All other systems negative, except as noted in the HPI. Review of Systems  Objective: Vital Signs: There were no vitals taken for this visit.  Specialty Comments:  No specialty comments available.  PMFS History: Patient Active Problem List   Diagnosis Date Noted  . Closed displaced fracture of right femoral neck (HCC) 12/13/2016   Past Medical History:  Diagnosis Date  . Dementia   . Hypertension     No family history on file.  No past surgical history on file. Social History   Occupational History  . Not on file.   Social History Main Topics  . Smoking status: Former Games developermoker  . Smokeless tobacco: Never Used  . Alcohol use No  . Drug use: No  . Sexual activity: Not on file

## 2016-12-13 NOTE — H&P (Signed)
Date: 12/13/2016               Patient Name:  Shannon Bright MRN: [id number]  DOB: 1932-02-01 Age / Sex: 83 y.o., female   PCP: System, Pcp Not In         Medical Service: Internal Medicine Teaching Service         Attending Physician: Dr. Mikey Bussing, Marthenia Rolling, DO    First Contact: Dr. Anthonette Legato Pager: [phone]  Second Contact: Dr. Nelson Chimes Pager: [phone]       After Hours (After 5p/  First Contact Pager: [phone]  weekends / holidays): Second Contact Pager: [phone]   Chief Complaint: Fall  History of Present Illness: Shannon Bright is an 83 yo F with past medical history of HTN and dementia who presented to the ED from orthopedic clinic after a fall with R hip fracture on XR. Shannon Bright who is Shannon primary caregiver was present and provided the history.  Yesterday, Shannon Bright was changing Shannon briefs in Shannon bedroom when the pt became off balance and fell on to Shannon right side. She had no complaints of light-headedness or palpitations prior to the fall, no LOC. She did not complain of any hip pain following the fall or this morning but the Bright noticed that she was not walking. She did note moving Shannon right lower extremity spontaneously. At baseline she is mobile without a cane or walker, can walk up stairs. She denies a history of prior falls. Denies dysuria but notes Shannon urine smelled strong yesterday, Shannon Bright gave Shannon a dose of Cipro left over from prior prescription. She denies other symptoms including generalized fatigue or weakness, chest pain, shortness of breath, n/v/d, and she has had normal PO intake. She does not perform Shannon ADLs, can feed herself with encouragement, only recognizes Shannon Bright and Shannon participation in conversation is mostly with Shannon Bright per Shannon report.  Today she presented to [facility] with Dr. Lajoyce Corners who obtained R Hip XR. Imaging revealed impacted subcapital fracture of the right hip. She was sent to the hospital with plan for admission and  surgical repair of R hip tomorrow.   In the ED, T 98.2, HR 79, 153/88, 97% on RA. Labs showed Hgb 11.1, U/A negative for infection, otherwise unremarkable. She was admitted for operative repair of Shannon hip fracture.      Meds:  Current Meds  Medication Sig  . acetaminophen (TYLENOL) 325 MG tablet Take 650 mg by mouth every 6 (six) hours as needed for mild pain.  Marland Kitchen amLODipine (NORVASC) 10 MG tablet Take 10 mg by mouth daily.  Marland Kitchen aspirin EC 81 MG tablet Take 81 mg by mouth daily.  . ciprofloxacin (CIPRO) 250 MG tablet Take 250 mg by mouth 2 (two) times daily. For 7 days; started on 11-09-16  . donepezil (ARICEPT) 10 MG tablet Take 10 mg by mouth at bedtime.  . dorzolamide-timolol (COSOPT) 22.3-6.8 MG/ML ophthalmic solution Place 1 drop into the left eye 2 (two) times daily.  Marland Kitchen latanoprost (XALATAN) 0.005 % ophthalmic solution Place 1 drop into the left eye at bedtime.  Marland Kitchen losartan-hydrochlorothiazide (HYZAAR) 100-12.5 MG tablet Take 1 tablet by mouth daily.  . Melatonin 1 MG TABS Take 1 mg by mouth at bedtime.  . memantine (NAMENDA) 10 MG tablet Take 10 mg by mouth 2 (two) times daily.  . potassium chloride (K-DUR) 10 MEQ tablet Take 20 mEq by mouth daily.   . ranitidine (ZANTAC) 150 MG tablet Take  150 mg by mouth 2 (two) times daily.  . risperiDONE (RISPERDAL) 0.25 MG tablet Take 0.25 mg by mouth at bedtime.  . simvastatin (ZOCOR) 20 MG tablet Take 20 mg by mouth daily.     Allergies: Allergies as of 12/13/2016  . (No Known Allergies)   Past Medical History:  Diagnosis Date  . Dementia   . Hypertension     Family History: Family history reviewed and is non-contributory.   Social History:  Social History  Substance Use Topics  . Smoking status: Former Games developermoker  . Smokeless tobacco: Never Used  . Alcohol use No     Review of Systems: A complete ROS was negative except as per HPI.   Physical Exam: Blood pressure (!) 163/63, pulse 76, temperature 98.2 F (36.8 C), temperature  source Axillary, resp. rate 15, SpO2 96 %. Physical Exam  Constitutional:  Elderly female resting in bed, no acute distress   HENT:  Head: Normocephalic and atraumatic.  Mouth/Throat: Oropharynx is clear and moist.  Cardiovascular: Normal rate and regular rhythm.   Systolic murmur   Pulmonary/Chest: Effort normal and breath sounds normal. No stridor. No respiratory distress.  Abdominal: Soft. She exhibits no distension. There is no tenderness.  No suprapubic tenderness   Musculoskeletal: She exhibits no edema.  No obvious deformity over R hip, no tenderness to light palpation   Neurological:  Intermittently responsive, does not follow commands   Skin: Skin is warm and dry.     EKG: personally reviewed my interpretation is sinus rhythm, normal rate.   CXR: personally reviewed my interpretation is no acute abnormalities.   Assessment & Plan by Problem:  R Hip Fracture Pt presenting s/p suspected mechanical fall from orthopedic office with confirmed R impacted subcapital hip fracture with plans in place for surgical repair with Orthopedic surgery tomorrow. Extensive vascular calcifications are noted on imaging as well. Expectant management for recovery/prognosis discussed as an outpatient with orthopedic surgery. She is not currently complaining of pain. --NPO at midnight --Neurovascular checks --SCDs, hold other VTE ppx   --F/u surgery post-op recommendations    Dementia At baseline pt is unable to complete ADLs, is only able to recognize Shannon Bright and is less interactive with other people. Shannon Bright is primary caregiver. She denies a history of sun-downing or delirium but notes she may be confused if she (Bright) is not present.  --Continue home donepezil, risperidone --Delirium precautions   Hypertension Pt with history of hypertension, has not taken medications today. On home regimen of Amlodipine 10 mg, Losartan-HCTZ combination, unsure of dose.  --Restart home  medications, Give hyzaar 100-12.5 mg for now  Glaucoma Stable, on home eye drop regimen, will continue as an inpatient.   Dispo: Admit patient to Inpatient with expected length of stay greater than 2 midnights.  Signed: Ginger CarneHarden, Steffie Waggoner, MD 12/13/2016, 5:44 PM  Pager: [phone]

## 2016-12-13 NOTE — ED Provider Notes (Signed)
[facility]-EMERGENCY DEPT Provider Note   CSN: [id number] Arrival date & time: 12/13/16  1430     History   Chief Complaint Chief Complaint  Patient presents with  . Fall  . Hip Fx   HPI  Shannon Bright is a 83 y.o. female with PMH significant for dementia and HTN who presents with right hip fracture after a mechanical fall yesterday evening. Patient's daughter is at bedside, from whom history was obtained. Patient was walking yesterday and lost her balance, fell on her right hip. No LOC or head trauma. Patient's daughter helped her up that evening and patient went to bed. Daughter states that patient did not complain of any pain, lightheadedness, chest pain, or headaches but was walking with a limp after the fall.  This morning, they went to [facility]. [facility] radiographs showed displaced femoral neck fracture on the right. She was sent to the emergency room by Dr. Lajoyce Corners with plan for surgery tomorrow.  Patient has dementia and is normally able to walk around on her own without a walker. Lives at home with daughter, who helps her with all ADLs and medication administration. Patient is not alert or oriented but denies right hip pain or chest pain, only stating that her hands are cold.  Past Medical History:  Diagnosis Date  . Dementia   . Hypertension     Patient Active Problem List   Diagnosis Date Noted  . Closed displaced fracture of right femoral neck (HCC) 12/13/2016    History reviewed. No pertinent surgical history.  OB History    No data available       Home Medications    Prior to Admission medications   Medication Sig Start Date End Date Taking? Authorizing Provider  acetaminophen (TYLENOL) 325 MG tablet Take 650 mg by mouth every 6 (six) hours as needed for mild pain.    [provider]  amLODipine (NORVASC) 10 MG tablet Take 10 mg by mouth daily.    [provider]  aspirin EC 81 MG tablet Take 81 mg by mouth daily.     [provider]  ciprofloxacin (CIPRO) 250 MG tablet Take 250 mg by mouth 2 (two) times daily. For 7 days; started on 11-09-16    [provider]  donepezil (ARICEPT) 10 MG tablet Take 10 mg by mouth at bedtime.    [provider]  dorzolamide-timolol (COSOPT) 22.3-6.8 MG/ML ophthalmic solution Place 1 drop into the left eye 2 (two) times daily.    [provider]  latanoprost (XALATAN) 0.005 % ophthalmic solution Place 1 drop into the left eye at bedtime.    [provider]  losartan-hydrochlorothiazide (HYZAAR) 100-12.5 MG tablet Take 1 tablet by mouth daily.    [provider]  losartan-hydrochlorothiazide (HYZAAR) 100-25 MG per tablet Take 1 tablet by mouth daily.    [provider]  Melatonin 1 MG TABS Take 1 mg by mouth at bedtime.    [provider]  memantine (NAMENDA) 10 MG tablet Take 10 mg by mouth 2 (two) times daily.    [provider]  potassium chloride (K-DUR) 10 MEQ tablet Take 20 mEq by mouth daily.     [provider]  ranitidine (ZANTAC) 150 MG tablet Take 150 mg by mouth 2 (two) times daily.    [provider]  risperiDONE (RISPERDAL) 0.25 MG tablet Take 0.25 mg by mouth at bedtime.    [provider]  simvastatin (ZOCOR) 20 MG tablet Take 20 mg  by mouth daily.    [provider]    Family History History reviewed. No pertinent family history.  Social History Social History  Substance Use Topics  . Smoking status: Former Games developermoker  . Smokeless tobacco: Never Used  . Alcohol use No     Allergies   Patient has no known allergies.   Review of Systems Review of Systems Patient not alert or oriented; unable to obtain  Physical Exam Updated Vital Signs BP (!) 153/88   Pulse 79   Temp 98.2 F (36.8 C) (Axillary)   Resp 20   SpO2 97%   Physical Exam GEN: Elderly female in NAD; not alert; not oriented; does not follow commands EYES: PERRL. Sclera  anicteric. RESP: Clear to auscultation anteriorly. No wheezes, rales, or rhonchi. No increased work of breathing. CV: Normal rate and regular rhythm. Systolic murmur. No LE edema. ABD: Soft. Non-tender. Non-distended. Normoactive bowel sounds. EXT: No edema. Palpable DP pulses bilaterally. Pain with movement of right leg. NEURO: Unable to obtain due to patient's dementia; not following commands.  ED Treatments / Results  Labs (all labs ordered are listed, but only abnormal results are displayed) Labs Reviewed  CBC WITH DIFFERENTIAL/PLATELET  COMPREHENSIVE METABOLIC PANEL  URINALYSIS, ROUTINE W REFLEX MICROSCOPIC    EKG  EKG Interpretation  Date/Time:  Tuesday December 13 2016 14:36:16 EDT Ventricular Rate:  82 PR Interval:    QRS Duration: 100 QT Interval:  389 QTC Calculation: 455 R Axis:   44 Text Interpretation:  Sinus rhythm Minimal ST depression, inferior leads Confirmed by Benjiman CorePickering, Nathan [phone]([id number]) on 12/13/2016 3:01:12 PM       Radiology Dg Chest Port 1 View  Result Date: 12/13/2016 CLINICAL DATA:  Preop for hip fracture.  Dementia and hypertension. EXAM: PORTABLE CHEST 1 VIEW COMPARISON:  03/11/2013 FINDINGS: Mild to moderate right hemidiaphragm elevation. Patient rotated right. Normal heart size for level of inspiration. No pleural effusion or pneumothorax. Atherosclerosis in the transverse aorta. No congestive failure. Clear lungs. IMPRESSION: Low lung volumes, without acute disease. Aortic Atherosclerosis (ICD10-I70.0). Electronically Signed   By: Jeronimo GreavesKyle  Talbot M.D.   On: 12/13/2016 15:08   Dg Hip Unilat With Pelvis 2-3 Views Right  Result Date: 12/13/2016 CLINICAL DATA:  Larey SeatFell 12/12/2016.  Persistent pain. EXAM: DG HIP (WITH OR WITHOUT PELVIS) 2-3V RIGHT COMPARISON:  None. FINDINGS: Impacted subcapital fracture of the right hip. Moderate right hip joint degenerative changes. The visualized right hemipelvis is intact. The pubic symphysis and SI joints are intact. Extensive  vascular calcifications are noted. IMPRESSION: Impacted subcapital fracture of the right hip. Electronically Signed   By: Rudie MeyerP.  Gallerani M.D.   On: 12/13/2016 11:39    Procedures Procedures (including critical care time)  Medications Ordered in ED Medications - No data to display   Initial Impression / Assessment and Plan / ED Course  I have reviewed the triage vital signs and the nursing notes.  Pertinent labs & imaging results that were available during my care of the patient were reviewed by me and considered in my medical decision making (see chart for details).  Ms. Shannon Bright is an 83yo female with baseline dementia who presents with fracture of right hip after mechanical fall yesterday evening. [facility] her here with plan for surgery tomorrow with Dr. Artist PaisYoo. No other signs of injury or trauma. CBC, CMP, and UA ordered. EKG with sinus rhythm. CXR for preop eval with no acute cardiopulmonary disease but low lung volumes. Patient to be  admitted to IMTS.  Final Clinical Impressions(s) / ED Diagnoses   Final diagnoses:  None  Right hip fracture, after mechanical fall yesterday evening  New Prescriptions New Prescriptions   No medications on file     Scherrie Gerlach, MD 12/13/16 1540    Benjiman Core, MD 12/14/16 [phone]

## 2016-12-14 ENCOUNTER — Encounter (HOSPITAL_COMMUNITY): Payer: Self-pay | Admitting: Certified Registered"

## 2016-12-14 ENCOUNTER — Inpatient Hospital Stay (HOSPITAL_COMMUNITY): Payer: Medicare (Managed Care)

## 2016-12-14 DIAGNOSIS — H409 Unspecified glaucoma: Secondary | ICD-10-CM

## 2016-12-14 DIAGNOSIS — I10 Essential (primary) hypertension: Secondary | ICD-10-CM

## 2016-12-14 DIAGNOSIS — M810 Age-related osteoporosis without current pathological fracture: Secondary | ICD-10-CM | POA: Diagnosis present

## 2016-12-14 DIAGNOSIS — R011 Cardiac murmur, unspecified: Secondary | ICD-10-CM

## 2016-12-14 DIAGNOSIS — M80051A Age-related osteoporosis with current pathological fracture, right femur, initial encounter for fracture: Principal | ICD-10-CM

## 2016-12-14 DIAGNOSIS — F0390 Unspecified dementia without behavioral disturbance: Secondary | ICD-10-CM | POA: Diagnosis present

## 2016-12-14 DIAGNOSIS — I350 Nonrheumatic aortic (valve) stenosis: Secondary | ICD-10-CM

## 2016-12-14 DIAGNOSIS — I70209 Unspecified atherosclerosis of native arteries of extremities, unspecified extremity: Secondary | ICD-10-CM | POA: Insufficient documentation

## 2016-12-14 DIAGNOSIS — I5189 Other ill-defined heart diseases: Secondary | ICD-10-CM | POA: Insufficient documentation

## 2016-12-14 DIAGNOSIS — S72001A Fracture of unspecified part of neck of right femur, initial encounter for closed fracture: Secondary | ICD-10-CM

## 2016-12-14 DIAGNOSIS — Z87891 Personal history of nicotine dependence: Secondary | ICD-10-CM

## 2016-12-14 LAB — GLUCOSE, CAPILLARY: Glucose-Capillary: 98 mg/dL (ref 65–99)

## 2016-12-14 LAB — ECHOCARDIOGRAM COMPLETE
Height: 64 in
Weight: 1744 oz

## 2016-12-14 LAB — SURGICAL PCR SCREEN
MRSA, PCR: NEGATIVE
Staphylococcus aureus: NEGATIVE

## 2016-12-14 MED ORDER — ACETAMINOPHEN 325 MG PO TABS
650.0000 mg | ORAL_TABLET | Freq: Four times a day (QID) | ORAL | Status: DC | PRN
  Administered 2016-12-14 (×2): 650 mg via ORAL
  Filled 2016-12-14 (×2): qty 2

## 2016-12-14 MED ORDER — CEFAZOLIN SODIUM-DEXTROSE 2-4 GM/100ML-% IV SOLN
INTRAVENOUS | Status: AC
  Filled 2016-12-14: qty 100

## 2016-12-14 MED ORDER — PROPOFOL 10 MG/ML IV BOLUS
INTRAVENOUS | Status: AC
Start: 2016-12-14 — End: ?
  Filled 2016-12-14: qty 20

## 2016-12-14 MED ORDER — TRANEXAMIC ACID 1000 MG/10ML IV SOLN
1000.0000 mg | INTRAVENOUS | Status: DC
  Filled 2016-12-14: qty 10

## 2016-12-14 MED ORDER — VANCOMYCIN HCL 10 G IV SOLR: 1500.0000 mg | INTRAVENOUS | Status: DC

## 2016-12-14 MED ORDER — FAMOTIDINE 20 MG PO TABS
20.0000 mg | ORAL_TABLET | Freq: Every day | ORAL | Status: DC
  Administered 2016-12-16 – 2016-12-19 (×4): 20 mg via ORAL
  Filled 2016-12-14 (×5): qty 1

## 2016-12-14 MED ORDER — LACTATED RINGERS IV SOLN
INTRAVENOUS | Status: DC
  Administered 2016-12-15 (×2): via INTRAVENOUS

## 2016-12-14 MED ORDER — VANCOMYCIN HCL IN DEXTROSE 1-5 GM/200ML-% IV SOLN: 1000.0000 mg | INTRAVENOUS | Status: DC

## 2016-12-14 MED ORDER — DEXTROSE 5 % IV SOLN: 3.0000 g | INTRAVENOUS | Status: DC

## 2016-12-14 MED ORDER — FENTANYL CITRATE (PF) 250 MCG/5ML IJ SOLN
INTRAMUSCULAR | Status: AC
  Filled 2016-12-14: qty 5

## 2016-12-14 MED ORDER — CEFAZOLIN SODIUM-DEXTROSE 2-4 GM/100ML-% IV SOLN: 2.0000 g | INTRAVENOUS | Status: DC

## 2016-12-14 MED ORDER — POVIDONE-IODINE 10 % EX SWAB: 2.0000 "application " | Freq: Once | CUTANEOUS | Status: DC

## 2016-12-14 NOTE — Progress Notes (Signed)
   Subjective: No acute events overnight, pt was NPO in preparation for surgery today. Prior to operation, anesthesia also noted systolic murmur, felt echo prior to surgery would be beneficial. She is not voicing complaints of hip pain, is at her baseline mental status per her daughter.   Objective:  Vital signs in last 24 hours: Vitals:   12/14/16 0040 12/14/16 0700 12/14/16 0905 12/14/16 0937  BP: (!) 145/61 (!) 160/57 (!) 171/76   Pulse:  65 74   Resp: 16 16 20    Temp: 98 F (36.7 C) 97.6 F (36.4 C) 97.7 F (36.5 C)   TempSrc: Axillary Axillary Axillary   SpO2: 94% 100% 92%   Weight:    109 lb (49.4 kg)  Height:    5\' 4"  (1.626 m)   Physical Exam  Constitutional:  Frail elderly woman resting in bed, no acute distress   HENT:  Head: Normocephalic and atraumatic.  Cardiovascular: Normal rate and regular rhythm.   Systolic murmur   Pulmonary/Chest: Effort normal. No respiratory distress.  Abdominal: Soft. There is no tenderness.  Musculoskeletal:  No tenderness to light palpation of R hip   Neurological:  Alert and oriented to self only c/w baseline   Skin: Skin is warm and dry.     Assessment/Plan:   R Hip Fracture Pt presented with impacted subcapital hip fracture s/p mechanical fall. Expectant management for recovery/prognosis discussed as an outpatient with orthopedic surgery. Surgery was delayed for pre-operative evaluation of cardiac murmur. She is not currently complaining of pain. --Neurovascular checks --SCDs, hold other VTE ppx   --F/u further surgery recommendations    Systolic Murmur Pt with 2/6 systolic murmur on exam, daughter reports [facility] physicians were aware of murmur, no available echo in chart review. Anesthesia noted murmur in pre-operative assessment, an echo was ordered and cardiology consulted.  --F/u echo   Dementia At baseline pt is unable to complete ADLs, is only able to recognize her daughter and is less interactive with other people.  Her daughter is primary caregiver. She denies a history of sun-downing or delirium but notes she may be confused if she (daughter) is not present. Currently at baseline mental status  --Continue home donepezil, risperidone --Delirium precautions    Dispo: Anticipated discharge in approximately 2-3 day(s).   Ginger CarneHarden, Brytani Voth, MD 12/14/2016, 1:42 PM Pager: [phone]

## 2016-12-14 NOTE — H&P (Signed)
H&P update  The surgical history has been reviewed and remains accurate without interval change.  The patient was re-examined and patient's physiologic condition has not changed significantly in the last 30 days. The condition still exists that makes this procedure necessary. The treatment plan remains the same, without new options for care.  No new pharmacological allergies or types of therapy has been initiated that would change the plan or the appropriateness of the plan.  The patient and/or family understand the potential benefits and risks.  N. Michael Nicholas Trompeter, MD 12/14/2016 7:25 AM   

## 2016-12-14 NOTE — Anesthesia Preprocedure Evaluation (Addendum)
Anesthesia Evaluation  Patient identified by MRN, date of birth, ID band Patient awake    Reviewed: Allergy & Precautions, H&P , NPO status , Patient's Chart, lab work & pertinent test results  Airway Mallampati: II  TM Distance: >3 FB Neck ROM: Full    Dental no notable dental hx. (+) Edentulous Upper, Edentulous Lower, Dental Advisory Given   Pulmonary neg pulmonary ROS, former smoker,    Pulmonary exam normal breath sounds clear to auscultation       Cardiovascular hypertension,  Rhythm:Regular Rate:Normal + Systolic murmurs    Neuro/Psych Some dementia negative psych ROS   GI/Hepatic negative GI ROS, Neg liver ROS,   Endo/Other  negative endocrine ROS  Renal/GU negative Renal ROS  negative genitourinary   Musculoskeletal   Abdominal   Peds  Hematology  (+) anemia ,   Anesthesia Other Findings Cardiac murmur 3-4/6  Mild AS. LV function normal.   Reproductive/Obstetrics negative OB ROS                          Anesthesia Physical Anesthesia Plan  ASA: III  Anesthesia Plan: Spinal   Post-op Pain Management:    Induction: Intravenous  PONV Risk Score and Plan: 2 and Ondansetron, Dexamethasone and Propofol infusion  Airway Management Planned: Simple Face Mask  Additional Equipment:   Intra-op Plan:   Post-operative Plan:   Informed Consent: I have reviewed the patients History and Physical, chart, labs and discussed the procedure including the risks, benefits and alternatives for the proposed anesthesia with the patient or authorized representative who has indicated his/her understanding and acceptance.   Dental advisory given  Plan Discussed with: CRNA  Anesthesia Plan Comments:        Anesthesia Quick Evaluation

## 2016-12-14 NOTE — Progress Notes (Signed)
  Echocardiogram 2D Echocardiogram has been performed.  Shannon SavoyCasey N Rebel Bright 12/14/2016, 3:58 PM

## 2016-12-14 NOTE — Consult Note (Signed)
Cardiology Consultation:   Patient ID: Shannon Bright; [id number]; [birth year]   Admit date: 12/13/2016 Date of Consult: 12/14/2016  Primary Care Provider: System, Pcp Not In Primary Cardiologist: New  Patient Profile:   Shannon Bright is a 83 y.o. female with a hx of Hypertension and dementia who is being seen today for the evaluation of cardiac murmur at the request of Dr. Jacklynn Bue.  History of Present Illness:   Shannon Bright presented to the orthopedic clinic yesterday after a mechanical fall while dressing. She was found to have a right hip fracture on xray. She denied light-headedness or palpitations prior to fall and she had no loss of consciousness. She presented to Dr. Audrie Lia office, was seen by Dr. Roda Shutters,  and subsequently was sent to the hospital to plan for admission and surgical repair of her right hip.  In the ED, T 98.2, HR 79, 153/88, 97% on RA. Labs showed Hgb 11.1, U/A negative for infection, otherwise unremarkable.   During the anesthesia preoperative evaluation a cardiac systolic  murmur 4-0/9 noted and a pre-op echo was recommended prompting cardiology consult. The patient is demented and does not contribute to the interview. Shannon Bright who are present say that [facility] knew she had a heart murmur.  Past Medical History:  Diagnosis Date  . Dementia   . Heart murmur   . Hypertension     History reviewed. No pertinent surgical history.     Inpatient Medications: Scheduled Meds: . amLODipine  10 mg Oral Daily  . donepezil  10 mg Oral QHS  . dorzolamide-timolol  1 drop Left Eye BID  . losartan  100 mg Oral Daily   And  . hydrochlorothiazide  12.5 mg Oral Daily  . latanoprost  1 drop Left Eye QHS  . Melatonin  3 mg Oral QHS  . memantine  10 mg Oral BID  . potassium chloride SA  20 mEq Oral Daily  . risperiDONE  0.25 mg Oral QHS  . simvastatin  20 mg Oral Daily   Continuous Infusions: . lactated ringers     PRN Meds: senna-docusate  Allergies:   No Known  Allergies  Social History:   Social History   Social History  . Marital status: Divorced    Spouse name: N/A  . Number of children: N/A  . Years of education: N/A   Occupational History  . Not on file.   Social History Main Topics  . Smoking status: Former Games developer  . Smokeless tobacco: Never Used  . Alcohol use No  . Drug use: No  . Sexual activity: Not Currently   Other Topics Concern  . Not on file   Social History Narrative  . No narrative on file    Family History:   Family History  Problem Relation Age of Onset  . Hypertension Daughter   . Diabetes Daughter   . Hypertension Daughter   . Hypertension Son      ROS:  Please see the history of present illness.  ROS  All other ROS reviewed and negative.     Physical Exam/Data:   Vitals:   12/14/16 0040 12/14/16 0700 12/14/16 0905 12/14/16 0937  BP: (!) 145/61 (!) 160/57 (!) 171/76   Pulse:  65 74   Resp: 16 16 20    Temp: 98 F (36.7 C) 97.6 F (36.4 C) 97.7 F (36.5 C)   TempSrc: Axillary Axillary Axillary   SpO2: 94% 100% 92%   Weight:    109 lb (49.4 kg)  Height:    5\' 4"  (1.626 m)    Intake/Output Summary (Last 24 hours) at 12/14/16 1254 Last data filed at 12/14/16 0701  Gross per 24 hour  Intake                0 ml  Output              500 ml  Net             -500 ml   Filed Weights   12/14/16 0937  Weight: 109 lb (49.4 kg)   Body mass index is 18.71 kg/m.  General:  Elderly, frail female HEENT: normal Lymph: no adenopathy Endocrine:  No thryomegaly Vascular: No carotid bruits; FA pulses 2+ bilaterally without bruits  Cardiac:  normal S1, S2; RRR; 2/6 systolic murmur Lungs:  clear to auscultation bilaterally, no wheezing, rhonchi or rales  Abd: soft, nontender, no hepatomegaly  Ext: no edema Skin: warm and dry  Neuro:  Unable to assess as the patient is asleep  EKG:  The EKG was personally reviewed and demonstrates:  Heart rate 82, sinus rhythm Telemetry:  Telemetry was personally  reviewed and demonstrates:  Not telemetry in place.  Relevant CV Studies: Echocardiogram is pending  Laboratory Data:  Chemistry  Recent Labs Lab 12/13/16 1532  NA 142  K 3.4*  CL 105  CO2 26  GLUCOSE 137*  BUN 18  CREATININE 0.86  CALCIUM 9.6  GFRNONAA >60  GFRAA >60  ANIONGAP 11     Recent Labs Lab 12/13/16 1532  PROT 7.4  ALBUMIN 3.7  AST 17  ALT 15  ALKPHOS 45  BILITOT 0.7   Hematology  Recent Labs Lab 12/13/16 1532  WBC 10.4  RBC 4.49  HGB 11.1*  HCT 34.8*  MCV 77.5*  MCH 24.7*  MCHC 31.9  RDW 15.6*  PLT 279    Radiology/Studies:  Dg Chest Port 1 View  Result Date: 12/13/2016 CLINICAL DATA:  Preop for hip fracture.  Dementia and hypertension. EXAM: PORTABLE CHEST 1 VIEW COMPARISON:  03/11/2013 FINDINGS: Mild to moderate right hemidiaphragm elevation. Patient rotated right. Normal heart size for level of inspiration. No pleural effusion or pneumothorax. Atherosclerosis in the transverse aorta. No congestive failure. Clear lungs. IMPRESSION: Low lung volumes, without acute disease. Aortic Atherosclerosis (ICD10-I70.0). Electronically Signed   By: Jeronimo Greaves M.D.   On: 12/13/2016 15:08   Dg Hip Unilat With Pelvis 2-3 Views Right  Result Date: 12/13/2016 CLINICAL DATA:  Larey Seat 12/12/2016.  Persistent pain. EXAM: DG HIP (WITH OR WITHOUT PELVIS) 2-3V RIGHT COMPARISON:  None. FINDINGS: Impacted subcapital fracture of the right hip. Moderate right hip joint degenerative changes. The visualized right hemipelvis is intact. The pubic symphysis and SI joints are intact. Extensive vascular calcifications are noted. IMPRESSION: Impacted subcapital fracture of the right hip. Electronically Signed   By: Rudie Meyer M.D.   On: 12/13/2016 11:39    Assessment and Plan:   1.  Right hip fracture: Dr.Xu would like to operate on hip fracture but heart murmur needs to be further evaluated first to assess risk of anesthesia.  2.  Systolic Heart Murmur: Likely to be coming  from the aortic valve.  Echocardiogram ordered. I spoke with echo department and they will have the next available technician perform [name] echocardiogram.   SignedDorthula Matas, PA-C  12/14/2016 12:54 PM   Patient examined chart reviewed discussed care with family and PA. Patient not verbal. Dementia. Frail elderly black female with right hip  fracture. No cardiac history 2/6 early peaking SEM radiates to right lateral sternal border with preserved S2. Doubt significant AS. Echo ordered Should be fine to pursue surgery in am. CXR no cardiomegaly and ECG with no LVH.   Charlton HawsPeter Mayco Walrond

## 2016-12-15 ENCOUNTER — Inpatient Hospital Stay (HOSPITAL_COMMUNITY): Payer: Medicare (Managed Care) | Admitting: Certified Registered"

## 2016-12-15 ENCOUNTER — Encounter (HOSPITAL_COMMUNITY)
Admission: EM | Disposition: A | Discharge status: Skilled Nursing Facility | Payer: Self-pay | Source: Home / Self Care | Attending: Internal Medicine

## 2016-12-15 ENCOUNTER — Inpatient Hospital Stay (HOSPITAL_COMMUNITY): Payer: Medicare (Managed Care)

## 2016-12-15 ENCOUNTER — Encounter (HOSPITAL_COMMUNITY): Payer: Self-pay | Admitting: Anesthesiology

## 2016-12-15 DIAGNOSIS — S72001A Fracture of unspecified part of neck of right femur, initial encounter for closed fracture: Secondary | ICD-10-CM

## 2016-12-15 DIAGNOSIS — I350 Nonrheumatic aortic (valve) stenosis: Secondary | ICD-10-CM

## 2016-12-15 HISTORY — PX: TOTAL HIP ARTHROPLASTY: SHX124

## 2016-12-15 LAB — GLUCOSE, CAPILLARY: Glucose-Capillary: 156 mg/dL — ABNORMAL HIGH (ref 65–99)

## 2016-12-15 LAB — CBC
HCT: 37.3 % (ref 36.0–46.0)
Hemoglobin: 11.8 g/dL — ABNORMAL LOW (ref 12.0–15.0)
MCH: 24.7 pg — AB (ref 26.0–34.0)
MCHC: 31.6 g/dL (ref 30.0–36.0)
MCV: 78.2 fL (ref 78.0–100.0)
PLATELETS: 293 10*3/uL (ref 150–400)
RBC: 4.77 MIL/uL (ref 3.87–5.11)
RDW: 15.3 % (ref 11.5–15.5)
WBC: 14.7 10*3/uL — ABNORMAL HIGH (ref 4.0–10.5)

## 2016-12-15 LAB — VITAMIN D 25 HYDROXY (VIT D DEFICIENCY, FRACTURES): Vit D, 25-Hydroxy: 46.1 ng/mL (ref 30.0–100.0)

## 2016-12-15 LAB — CREATININE, SERUM
CREATININE: 0.74 mg/dL (ref 0.44–1.00)
GFR calc Af Amer: >60 mL/min (ref >60)
GFR calc non Af Amer: >60 mL/min (ref >60)

## 2016-12-15 SURGERY — ARTHROPLASTY, HIP, TOTAL, ANTERIOR APPROACH
Anesthesia: Spinal | Site: Hip | Laterality: Right

## 2016-12-15 MED ORDER — FENTANYL CITRATE (PF) 250 MCG/5ML IJ SOLN
INTRAMUSCULAR | Status: AC
  Filled 2016-12-15: qty 5

## 2016-12-15 MED ORDER — ONDANSETRON HCL 4 MG/2ML IJ SOLN
INTRAMUSCULAR | Status: DC | PRN
  Administered 2016-12-15: 4 mg via INTRAVENOUS

## 2016-12-15 MED ORDER — FENTANYL CITRATE (PF) 100 MCG/2ML IJ SOLN
INTRAMUSCULAR | Status: DC | PRN
  Administered 2016-12-15: 25 ug via INTRAVENOUS

## 2016-12-15 MED ORDER — VANCOMYCIN HCL IN DEXTROSE 1-5 GM/200ML-% IV SOLN
INTRAVENOUS | Status: AC
  Filled 2016-12-15: qty 200

## 2016-12-15 MED ORDER — METOCLOPRAMIDE HCL 5 MG/ML IJ SOLN: 5.0000 mg | Freq: Three times a day (TID) | INTRAMUSCULAR | Status: DC | PRN

## 2016-12-15 MED ORDER — LACTATED RINGERS IV SOLN: INTRAVENOUS | Status: DC

## 2016-12-15 MED ORDER — CEFAZOLIN SODIUM-DEXTROSE 2-4 GM/100ML-% IV SOLN
INTRAVENOUS | Status: AC
Start: 2016-12-15 — End: 2016-12-15
  Filled 2016-12-15: qty 100

## 2016-12-15 MED ORDER — TRANEXAMIC ACID 1000 MG/10ML IV SOLN
2000.0000 mg | INTRAVENOUS | Status: DC
  Filled 2016-12-15: qty 20

## 2016-12-15 MED ORDER — SODIUM CHLORIDE 0.9 % IR SOLN
Status: DC | PRN
  Administered 2016-12-15: 3000 mL

## 2016-12-15 MED ORDER — ENOXAPARIN SODIUM 40 MG/0.4ML ~~LOC~~ SOLN: 40.0000 mg | Freq: Every day | SUBCUTANEOUS | 0 refills | Status: AC

## 2016-12-15 MED ORDER — TRANEXAMIC ACID 1000 MG/10ML IV SOLN
INTRAVENOUS | Status: AC | PRN
  Administered 2016-12-15: 2000 mg via TOPICAL

## 2016-12-15 MED ORDER — MENTHOL 3 MG MT LOZG: 1.0000 | LOZENGE | OROMUCOSAL | Status: DC | PRN

## 2016-12-15 MED ORDER — PROPOFOL 500 MG/50ML IV EMUL
INTRAVENOUS | Status: DC | PRN
  Administered 2016-12-15: 30 ug/kg/min via INTRAVENOUS

## 2016-12-15 MED ORDER — MORPHINE SULFATE (PF) 4 MG/ML IV SOLN: 0.5000 mg | INTRAVENOUS | Status: DC | PRN

## 2016-12-15 MED ORDER — BUPIVACAINE IN DEXTROSE 0.75-8.25 % IT SOLN
INTRATHECAL | Status: DC | PRN
  Administered 2016-12-15: 13.5 mg via INTRATHECAL

## 2016-12-15 MED ORDER — PROPOFOL 10 MG/ML IV BOLUS
INTRAVENOUS | Status: DC | PRN
  Administered 2016-12-15: 40 mg via INTRAVENOUS
  Administered 2016-12-15 (×2): 10 mg via INTRAVENOUS

## 2016-12-15 MED ORDER — ACETAMINOPHEN 650 MG RE SUPP: 650.0000 mg | Freq: Four times a day (QID) | RECTAL | Status: DC | PRN

## 2016-12-15 MED ORDER — 0.9 % SODIUM CHLORIDE (POUR BTL) OPTIME
TOPICAL | Status: DC | PRN
  Administered 2016-12-15: 1000 mL

## 2016-12-15 MED ORDER — OXYCODONE HCL 5 MG PO TABS
5.0000 mg | ORAL_TABLET | ORAL | Status: DC | PRN
  Filled 2016-12-15: qty 2

## 2016-12-15 MED ORDER — HYDROCODONE-ACETAMINOPHEN 5-325 MG PO TABS: 1.0000 | ORAL_TABLET | Freq: Four times a day (QID) | ORAL | Status: DC | PRN

## 2016-12-15 MED ORDER — ENOXAPARIN SODIUM 40 MG/0.4ML ~~LOC~~ SOLN
40.0000 mg | SUBCUTANEOUS | Status: DC
  Administered 2016-12-16 – 2016-12-18 (×3): 40 mg via SUBCUTANEOUS
  Filled 2016-12-15 (×3): qty 0.4

## 2016-12-15 MED ORDER — DEXAMETHASONE SODIUM PHOSPHATE 10 MG/ML IJ SOLN
INTRAMUSCULAR | Status: DC | PRN
  Administered 2016-12-15: 10 mg via INTRAVENOUS

## 2016-12-15 MED ORDER — ALUM & MAG HYDROXIDE-SIMETH 200-200-20 MG/5ML PO SUSP: 30.0000 mL | ORAL | Status: DC | PRN

## 2016-12-15 MED ORDER — ACETAMINOPHEN 325 MG PO TABS
650.0000 mg | ORAL_TABLET | Freq: Four times a day (QID) | ORAL | Status: DC | PRN
  Administered 2016-12-16 – 2016-12-18 (×6): 650 mg via ORAL
  Filled 2016-12-15 (×6): qty 2

## 2016-12-15 MED ORDER — HYDROCODONE-ACETAMINOPHEN 10-325 MG PO TABS: 1.0000 | ORAL_TABLET | Freq: Four times a day (QID) | ORAL | 0 refills | Status: AC | PRN

## 2016-12-15 MED ORDER — PROPOFOL 10 MG/ML IV BOLUS
INTRAVENOUS | Status: AC
  Filled 2016-12-15: qty 20

## 2016-12-15 MED ORDER — ONDANSETRON HCL 4 MG PO TABS: 4.0000 mg | ORAL_TABLET | Freq: Four times a day (QID) | ORAL | Status: DC | PRN

## 2016-12-15 MED ORDER — FENTANYL CITRATE (PF) 100 MCG/2ML IJ SOLN: 25.0000 ug | INTRAMUSCULAR | Status: DC | PRN

## 2016-12-15 MED ORDER — METHOCARBAMOL 1000 MG/10ML IJ SOLN
500.0000 mg | Freq: Four times a day (QID) | INTRAVENOUS | Status: DC | PRN
  Filled 2016-12-15: qty 5

## 2016-12-15 MED ORDER — PHENYLEPHRINE HCL 10 MG/ML IJ SOLN
INTRAVENOUS | Status: DC | PRN
  Administered 2016-12-15: 25 ug/min via INTRAVENOUS

## 2016-12-15 MED ORDER — DEXTROSE 5 % IV SOLN: 2000.0000 mg | Freq: Once | INTRAVENOUS | Status: DC

## 2016-12-15 MED ORDER — METHOCARBAMOL 500 MG PO TABS
500.0000 mg | ORAL_TABLET | Freq: Four times a day (QID) | ORAL | Status: DC | PRN
  Filled 2016-12-15: qty 1

## 2016-12-15 MED ORDER — GLYCOPYRROLATE 0.2 MG/ML IV SOSY
PREFILLED_SYRINGE | INTRAVENOUS | Status: DC | PRN
  Administered 2016-12-15: .1 mg via INTRAVENOUS
  Administered 2016-12-15: .2 mg via INTRAVENOUS

## 2016-12-15 MED ORDER — SODIUM CHLORIDE 0.9 % IV SOLN
INTRAVENOUS | Status: DC
  Administered 2016-12-17 – 2016-12-18 (×3): via INTRAVENOUS

## 2016-12-15 MED ORDER — CEFAZOLIN SODIUM-DEXTROSE 2-4 GM/100ML-% IV SOLN
2.0000 g | Freq: Once | INTRAVENOUS | Status: AC
  Administered 2016-12-15: 2 g via INTRAVENOUS

## 2016-12-15 MED ORDER — PHENOL 1.4 % MT LIQD: 1.0000 | OROMUCOSAL | Status: DC | PRN

## 2016-12-15 MED ORDER — TRANEXAMIC ACID 1000 MG/10ML IV SOLN
1000.0000 mg | INTRAVENOUS | Status: AC
  Administered 2016-12-15: 1000 mg via INTRAVENOUS
  Filled 2016-12-15: qty 10

## 2016-12-15 MED ORDER — CEFAZOLIN SODIUM-DEXTROSE 2-4 GM/100ML-% IV SOLN
2.0000 g | Freq: Three times a day (TID) | INTRAVENOUS | Status: AC
  Administered 2016-12-15 – 2016-12-16 (×3): 2 g via INTRAVENOUS
  Filled 2016-12-15 (×3): qty 100

## 2016-12-15 MED ORDER — METOCLOPRAMIDE HCL 5 MG PO TABS: 5.0000 mg | ORAL_TABLET | Freq: Three times a day (TID) | ORAL | Status: DC | PRN

## 2016-12-15 MED ORDER — ONDANSETRON HCL 4 MG/2ML IJ SOLN: 4.0000 mg | Freq: Four times a day (QID) | INTRAMUSCULAR | Status: DC | PRN

## 2016-12-15 SURGICAL SUPPLY — 46 items
APL SKNCLS STERI-STRIP NONHPOA (GAUZE/BANDAGES/DRESSINGS) ×1
BAG DECANTER FOR FLEXI CONT (MISCELLANEOUS) ×3 IMPLANT
BENZOIN TINCTURE PRP APPL 2/3 (GAUZE/BANDAGES/DRESSINGS) ×2 IMPLANT
CABLE ASSY CERCLAGE SST 1.8X55 (Orthopedic Implant) ×2 IMPLANT
CAPT HIP HEMI 2 ×2 IMPLANT
CELLS DAT CNTRL 66122 CELL SVR (MISCELLANEOUS) ×1 IMPLANT
CLOSURE STERI-STRIP 1/2X4 (GAUZE/BANDAGES/DRESSINGS) ×1
CLSR STERI-STRIP ANTIMIC 1/2X4 (GAUZE/BANDAGES/DRESSINGS) ×1 IMPLANT
COVER SURGICAL LIGHT HANDLE (MISCELLANEOUS) ×3 IMPLANT
DRAPE C-ARM 42X72 X-RAY (DRAPES) ×3 IMPLANT
DRAPE STERI IOBAN 125X83 (DRAPES) ×3 IMPLANT
DRAPE U-SHAPE 47X51 STRL (DRAPES) ×9 IMPLANT
DRSG AQUACEL AG ADV 3.5X10 (GAUZE/BANDAGES/DRESSINGS) ×3 IMPLANT
DURAPREP 26ML APPLICATOR (WOUND CARE) ×3 IMPLANT
ELECT BLADE 4.0 EZ CLEAN MEGAD (MISCELLANEOUS) ×3
ELECT REM PT RETURN 9FT ADLT (ELECTROSURGICAL) ×3
ELECTRODE BLDE 4.0 EZ CLN MEGD (MISCELLANEOUS) ×1 IMPLANT
ELECTRODE REM PT RTRN 9FT ADLT (ELECTROSURGICAL) ×1 IMPLANT
GLOVE SKINSENSE NS SZ7.5 (GLOVE) ×2
GLOVE SKINSENSE STRL SZ7.5 (GLOVE) ×1 IMPLANT
GLOVE SURG SYN 7.5  E (GLOVE) ×4
GLOVE SURG SYN 7.5 E (GLOVE) ×2 IMPLANT
GOWN SRG XL XLNG 56XLVL 4 (GOWN DISPOSABLE) ×1 IMPLANT
GOWN STRL NON-REIN XL XLG LVL4 (GOWN DISPOSABLE) ×3
GOWN STRL REUS W/ TWL LRG LVL3 (GOWN DISPOSABLE) IMPLANT
GOWN STRL REUS W/TWL LRG LVL3 (GOWN DISPOSABLE)
HANDPIECE INTERPULSE COAX TIP (DISPOSABLE) ×3
HOOD PEEL AWAY FLYTE STAYCOOL (MISCELLANEOUS) ×6 IMPLANT
IV NS IRRIG 3000ML ARTHROMATIC (IV SOLUTION) ×3 IMPLANT
KIT BASIN OR (CUSTOM PROCEDURE TRAY) ×3 IMPLANT
MARKER SKIN DUAL TIP RULER LAB (MISCELLANEOUS) ×3 IMPLANT
PACK TOTAL JOINT (CUSTOM PROCEDURE TRAY) ×3 IMPLANT
PACK UNIVERSAL I (CUSTOM PROCEDURE TRAY) ×3 IMPLANT
RTRCTR WOUND ALEXIS 18CM MED (MISCELLANEOUS) ×3
SAW OSC TIP CART 19.5X105X1.3 (SAW) ×3 IMPLANT
SET HNDPC FAN SPRY TIP SCT (DISPOSABLE) ×1 IMPLANT
STAPLER VISISTAT 35W (STAPLE) IMPLANT
SUT ETHIBOND 2 V 37 (SUTURE) ×3 IMPLANT
SUT MNCRL AB 3-0 PS2 18 (SUTURE) ×3 IMPLANT
SUT VIC AB 1 CT1 27 (SUTURE) ×3
SUT VIC AB 1 CT1 27XBRD ANBCTR (SUTURE) ×1 IMPLANT
SUT VIC AB 2-0 CT1 27 (SUTURE) ×3
SUT VIC AB 2-0 CT1 TAPERPNT 27 (SUTURE) ×1 IMPLANT
TOWEL OR 17X26 10 PK STRL BLUE (TOWEL DISPOSABLE) IMPLANT
TRAY CATH 16FR W/PLASTIC CATH (SET/KITS/TRAYS/PACK) ×3 IMPLANT
YANKAUER SUCT BULB TIP NO VENT (SUCTIONS) ×3 IMPLANT

## 2016-12-15 NOTE — Anesthesia Procedure Notes (Signed)
Spinal  Patient location during procedure: OR Start time: 12/15/2016 2:35 PM End time: 12/15/2016 2:46 PM Staffing Anesthesiologist: Gaynelle AduFITZGERALD, Jayshon Dommer Performed: anesthesiologist  Preanesthetic Checklist Completed: patient identified, surgical consent, pre-op evaluation, timeout performed, IV checked, risks and benefits discussed and monitors and equipment checked Spinal Block Patient position: left lateral decubitus Prep: DuraPrep Patient monitoring: cardiac monitor, continuous pulse ox and blood pressure Approach: midline Location: L3-4 Injection technique: single-shot Needle Needle type: Quincke  Needle gauge: 22 G Needle length: 9 cm Assessment Sensory level: T8 Additional Notes Functioning IV was confirmed and monitors were applied. Sterile prep and drape, including hand hygiene and sterile gloves were used. The patient was positioned and the spine was prepped. The skin was anesthetized with lidocaine.  Free flow of clear CSF was obtained prior to injecting local anesthetic into the CSF.  The spinal needle aspirated freely following injection.  The needle was carefully withdrawn.  The patient tolerated the procedure well.

## 2016-12-15 NOTE — Discharge Instructions (Signed)
° ° °  1. Change dressings as needed °2. May shower but keep incisions covered and dry °3. Take lovenox to prevent blood clots °4. Take stool softeners as needed °5. Take pain meds as needed ° °

## 2016-12-15 NOTE — H&P (Signed)
H&P update  The surgical history has been reviewed and remains accurate without interval change.  The patient was re-examined and patient's physiologic condition has not changed significantly in the last 30 days. The condition still exists that makes this procedure necessary. The treatment plan remains the same, without new options for care.  No new pharmacological allergies or types of therapy has been initiated that would change the plan or the appropriateness of the plan.  The patient and/or family understand the potential benefits and risks.  Mayra ReelN. Michael Xu, MD 12/15/2016 7:07 AM

## 2016-12-15 NOTE — Anesthesia Procedure Notes (Addendum)
Procedure Name: MAC Date/Time: 12/15/2016 2:40 PM Performed by: Merrilyn Puma B Pre-anesthesia Checklist: Patient identified, Emergency Drugs available, Suction available, Patient being monitored and Timeout performed Oxygen Delivery Method: Simple face mask Placement Confirmation: positive ETCO2 and breath sounds checked- equal and bilateral Dental Injury: Teeth and Oropharynx as per pre-operative assessment

## 2016-12-15 NOTE — Progress Notes (Signed)
Internal Medicine Attending:   I saw and examined the patient. I reviewed the resident's note and I agree with the resident's findings and plan as documented in the resident's note. No complaints, Ok to proceed with surgery today. For her Mild AS will need repeat echo in 3-5 years. Vit D level normal.

## 2016-12-15 NOTE — Progress Notes (Signed)
Progress Note  Patient Name: Shannon Bright Date of Encounter: 12/15/2016  Primary Cardiologist:  New  Subjective   Echocardiogram results show EF 60-65%, mild LVH, G1DD with mild aortic stenosis.  She is NPO for surgery today.  Inpatient Medications    Scheduled Meds: . amLODipine  10 mg Oral Daily  . donepezil  10 mg Oral QHS  . dorzolamide-timolol  1 drop Left Eye BID  . famotidine  20 mg Oral Daily  . losartan  100 mg Oral Daily   And  . hydrochlorothiazide  12.5 mg Oral Daily  . latanoprost  1 drop Left Eye QHS  . Melatonin  3 mg Oral QHS  . memantine  10 mg Oral BID  . potassium chloride SA  20 mEq Oral Daily  . risperiDONE  0.25 mg Oral QHS  . simvastatin  20 mg Oral Daily   Continuous Infusions: . lactated ringers Stopped (12/14/16 1900)   PRN Meds: acetaminophen, senna-docusate   Vital Signs    Vitals:   12/14/16 0905 12/14/16 0937 12/14/16 2100 12/15/16 0500  BP: (!) 171/76  (!) 140/50 (!) 148/54  Pulse: 74  70 66  Resp: Temp: 97.7 F (36.5 C)  98.1 F (36.7 C) (!) 97.5 F (36.4 C)  TempSrc: Axillary  Axillary Axillary  SpO2: 92%  92% 96%  Weight:  109 lb (49.4 kg)    Height:   (1.626 m)      Intake/Output Summary (Last 24 hours) at 12/15/16 1610 Last data filed at 12/15/16 0400  Gross per 24 hour  Intake                0 ml  Output              320 ml  Net             -320 ml   Filed Weights   12/14/16 0937  Weight: 109 lb (49.4 kg)    Telemetry    Not on Tele - Personally Reviewed   Physical Exam   GEN: frail, elderly female HEENT: normal  Neck: no JVD, carotid bruits, or masses Cardiac: RRR. no murmurs, rubs, or gallops,no edema. Intact distal pulses bilaterally.  Respiratory: clear to auscultation bilaterally, normal work of breathing GI: soft, nontender, nondistended, + BS MS: no deformity or atrophy  Skin: warm and dry, no rash Neuro: nonverbal   Labs    Chemistry Recent Labs Lab  12/13/16 1532  NA 142  K 3.4*  CL 105  CO2 26  GLUCOSE 137*  BUN 18  CREATININE 0.86  CALCIUM 9.6  PROT 7.4  ALBUMIN 3.7  AST 17  ALT 15  ALKPHOS 45  BILITOT 0.7  GFRNONAA >60  GFRAA >60  ANIONGAP 11     Hematology Recent Labs Lab 12/13/16 1532  WBC 10.4  RBC 4.49  HGB 11.1*  HCT 34.8*  MCV 77.5*  MCH 24.7*  MCHC 31.9  RDW 15.6*  PLT 279    Radiology    Dg Chest Port 1 View  Result Date: 12/13/2016 CLINICAL DATA:  Preop for hip fracture.  Dementia and hypertension. EXAM: PORTABLE CHEST 1 VIEW COMPARISON:  03/11/2013 FINDINGS: Mild to moderate right hemidiaphragm elevation. Patient rotated right. Normal heart size for level of inspiration. No pleural effusion or pneumothorax. Atherosclerosis in the transverse aorta. No congestive failure. Clear lungs. IMPRESSION: Low lung volumes, without acute disease. Aortic Atherosclerosis (ICD10-I70.0). Electronically Signed   By: Ronaldo Miyamoto  Reche Dixonalbot M.D.   On: 12/13/2016 15:08   Dg Hip Unilat With Pelvis 2-3 Views Right  Result Date: 12/13/2016 CLINICAL DATA:  Larey SeatFell 12/12/2016.  Persistent pain. EXAM: DG HIP (WITH OR WITHOUT PELVIS) 2-3V RIGHT COMPARISON:  None. FINDINGS: Impacted subcapital fracture of the right hip. Moderate right hip joint degenerative changes. The visualized right hemipelvis is intact. The pubic symphysis and SI joints are intact. Extensive vascular calcifications are noted. IMPRESSION: Impacted subcapital fracture of the right hip. Electronically Signed   By: Rudie MeyerP.  Gallerani M.D.   On: 12/13/2016 11:39    Cardiac Studies   ECHOCARDIOGRAM 12/14/2016  Study Conclusions  - Left ventricle: The cavity size was normal. Wall thickness was   increased in a pattern of mild LVH. Systolic function was normal.   The estimated ejection fraction was in the range of 60% to 65%.   Wall motion was normal; there were no regional wall motion   abnormalities. Doppler parameters are consistent with abnormal   left ventricular  relaxation (grade 1 diastolic dysfunction). - Aortic valve: Valve mobility was restricted. There was mild   stenosis.  Impressions:  - Normal LV systolic function; mild LVH; mild diastolic   dysfunction; calcified aortic valve with mild AS (mean gradient   10 mmHg).  Patient Profile   Shannon Bright is a 83 y.o. female with a hx of Hypertension and dementia who is being seen today for the evaluation of cardiac murmur pre operatively for a hip fracture.  Assessment & Plan     Right hip fracture: Plan for Dr. Roda ShuttersXu to take to surgery today for repair.  Aortic Stenosis:  Mild aortic stenosis with preserved EF.  Per chart review the Echo was reviewed by our service and patient cleared for surgery today. No further work-up or evaluation recommended at this time.  Signed, Shannon Bright,Shannon Bright, Shannon Bright  12/15/2016, 8:21 AM    Patient not verbal. Echo as expected with only mild AS Exam unchanged with right hip Fracture and mild AS murmur / and dementia Ok to proceed with surgery   Will sign off  Charlton HawsPeter Akaya Proffit

## 2016-12-15 NOTE — Progress Notes (Signed)
Cards has reviewed echo and determined it is fine to proceed with surgery today.  N. Michael Xu, MD [facility] [phone] 8:03 AM

## 2016-12-15 NOTE — Op Note (Signed)
RIGHT BIPOLAR HIP HEMIARTHROPLASTY  Procedure Note Shannon Bright   [id number]  Pre-op Diagnosis: right femoral neck fracture     Post-op Diagnosis: same   Operative Procedures  1. Prosthetic replacement for femoral neck fracture. CPT [phone]  Personnel  Surgeon(s): Tarry Kos, MD   Anesthesia: spinal  Prosthesis: depuy Femur: corail KA 10 Head: 44 mm size: +5 Bearing Type: bipolar  Hip Hemiarthroplasty (Anterior Approach) Op Note:  After informed consent was obtained and the operative extremity marked in the holding area, the patient was brought back to the operating room and placed supine on the HANA table. Next, the operative extremity was prepped and draped in normal sterile fashion. Surgical timeout occurred verifying patient identification, surgical site, surgical procedure and administration of antibiotics.  A modified anterior Smith-Peterson approach to the hip was performed, using the interval between tensor fascia lata and sartorius.  Dissection was carried bluntly down onto the anterior hip capsule. The lateral femoral circumflex vessels were identified and coagulated. A capsulotomy was performed and the capsular flaps tagged for later repair.  Fluoroscopy was utilized to prepare for the femoral neck cut. The neck osteotomy was performed. The femoral head was removed and found a 44 mm head was the appropriate fit.    We then turned our attention to the femur.  After placing the femoral hook, the leg was taken to externally rotated, extended and adducted position taking care to perform soft tissue releases to allow for adequate mobilization of the femur. Soft tissue was cleared from the shoulder of the greater trochanter and the hook elevator used to improve exposure of the proximal femur. A fracture of the posteromedial wall was identified.  This propagated to tht level of the lesser trochanter.  The fracture was stable but I felt it was best to place a cable just below the  lesser trochanter to keep the fracture from propagating.  Sequential broaching performed up to a size 10. Trial neck and head were placed. The leg was brought back up to neutral and the construct reduced. The position and sizing of components, offset and leg lengths were checked using fluoroscopy. Stability of the construct was checked in extension and external rotation without any subluxation or impingement of prosthesis. We dislocated the prosthesis, dropped the leg back into position, removed trial components, and irrigated copiously. The final stem and head was then placed, the leg brought back up, the system reduced and fluoroscopy used to verify positioning.  We irrigated, obtained hemostasis and closed the capsule using #2 ethibond suture.  The fascia was closed with #1 vicryl plus, the deep fat layer was closed with 0 vicryl, the subcutaneous layers closed with 2.0 Vicryl Plus and the skin closed with staples. A sterile dressing was applied. The patient was awakened in the operating room and taken to recovery in stable condition. All sponge, needle, and instrument counts were correct at the end of the case.   Position: supine  Complications: none.  Time Out: performed   Drains/Packing: none  Estimated blood loss: 100 cc  Returned to Recovery Room: in good condition.   Antibiotics: yes   Mechanical VTE (DVT) Prophylaxis: sequential compression devices, TED thigh-high  Chemical VTE (DVT) Prophylaxis: lovenox  Fluid Replacement: Crystalloid: see anesthesia record  Specimens Removed: 1 to pathology   Sponge and Instrument Count Correct? yes   PACU: portable radiograph - low AP   Admission: inpatient status, start PT & OT POD#1  Plan/RTC: Return in 2 weeks for staple  removal. Return in 6 weeks to see MD.  Weight Bearing/Load Lower Extremity: full  Hip precautions: none Suture Removal: 10-14 days  Betadine to incision twice daily once dressing is removed on POD#7  N. Glee ArvinMichael Xu,  MD [facility] [phone] 4:07 PM     Implant Name Type Inv. Item Serial No. Manufacturer Lot No. LRB No. Used  CABLE ASSY CERCLAGE SST 1.8MM - [id number] Orthopedic Implant CABLE ASSY CERCLAGE SST 1.8MM  ZIMMER CAROLINAS [id number] Right 1  STEM CORAIL KA10 - [id number] Stem STEM CORAIL KA10  DEPUY SYNTHES [id number] Right 1  HIP BALL ARTICU DEPUY - [id number] Hips HIP BALL ARTICU DEPUY  DEPUY SYNTHES [id number] Right 1  BIPOLAR PROS AML 44MM - [id number] Hips BIPOLAR PROS AML 44MM   DEPUY SYNTHES [id number] Right 1

## 2016-12-15 NOTE — Progress Notes (Signed)
   Subjective: No acute events overnight, pre-operative echo revealed mild aortic stenosis. She will proceed with surgery this morning and was NPO at midnight in preparation. Her daughter mentioned she complained of pain last night but was not in hip, likely misidentified pain due to dementia, given Tylenol.    Objective:  Vital signs in last 24 hours: Vitals:   12/14/16 0905 12/14/16 0937 12/14/16 2100 12/15/16 0500  BP: (!) 171/76  (!) 140/50 (!) 148/54  Pulse: 74  70 66  Resp: 20  20 20   Temp: 97.7 F (36.5 C)  98.1 F (36.7 C) (!) 97.5 F (36.4 C)  TempSrc: Axillary  Axillary Axillary  SpO2: 92%  92% 96%  Weight:  109 lb (49.4 kg)    Height:  5\' 4"  (1.626 m)     Physical Exam  Constitutional:  Frail elderly woman resting in bed, no acute distress   HENT:  Head: Normocephalic and atraumatic.  Cardiovascular: Normal rate and regular rhythm.   Systolic murmur   Pulmonary/Chest: Effort normal. No respiratory distress.  Abdominal: Soft. There is no tenderness.  Musculoskeletal:  No tenderness to light palpation of R hip   Neurological:  Alert and oriented to self only c/w baseline, interactive   Skin: Skin is warm and dry.    Assessment/Plan:  R Hip Fracture, Osteoporosis  Pt presented with impacted subcapital hip fracture s/p mechanical fall. Expectant management for recovery/prognosis discussed as an outpatient with orthopedic surgery. Surgery was delayed for pre-operative evaluation of cardiac murmur. She is not currently complaining of pain. She had a normal DEXA scan in 2015, however this fracture d/t fall from standing height qualifies her for osteoporosis diagnosis, would benefit from bisphosphonate therapy after acute peri-operative period.  --Neurovascular checks --SCDs, hold other VTE ppx   --F/u further post-operative surgery recommendations     Mild Aortic Stenosis  Pt with 2/6 systolic murmur on exam, daughter reports [facility] physicians were aware of murmur, no  available echo in chart review. Echo here revealed mild aortic stenosis, EF 60-65%, no contra-indication to surgery. Can consider f/u echo in several years or if pt develops sx.      Dementia At baseline pt is unable to complete ADLs, is only able to recognize her daughter and is less interactive with other people. Her daughter is primary caregiver. She denies a history of sun-downing or delirium but notes she may be confused if she (daughter) is not present. Currently at baseline mental status  --Continue home donepezil, risperidone --Delirium precautions    Dispo: Anticipated discharge in approximately 2-3 day(s).   Ginger CarneHarden, Crystina Borrayo, MD 12/15/2016, 7:09 AM Pager: [phone]

## 2016-12-15 NOTE — Transfer of Care (Signed)
Immediate Anesthesia Transfer of Care Note  Patient: Shannon Bright  Procedure(s) Performed: Procedure(s) with comments: RIGHT BIPOLAR HIP HEMIARTHROPLASTY (Right) - Needs RNFA  Patient Location: PACU  Anesthesia Type:MAC and Spinal  Level of Consciousness: awake and alert   Airway & Oxygen Therapy: Patient Spontanous Breathing  Post-op Assessment: Report given to RN and Post -op Vital signs reviewed and stable  Post vital signs: Reviewed and stable  Last Vitals:  Vitals:   12/14/16 2100 12/15/16 0500  BP: (!) 140/50 (!) 148/54  Pulse: 70 66  Resp: 20 20  Temp: 36.7 C (!) 36.4 C  SpO2: 92% 96%    Last Pain:  Vitals:   12/15/16 0500  TempSrc: Axillary  PainSc:          Complications: No apparent anesthesia complications

## 2016-12-15 NOTE — Anesthesia Postprocedure Evaluation (Signed)
Anesthesia Post Note  Patient: Shannon Bright  Procedure(s) Performed: Procedure(s) (LRB): RIGHT BIPOLAR HIP HEMIARTHROPLASTY (Right)     Patient location during evaluation: PACU Anesthesia Type: Spinal Level of consciousness: awake and alert (remains confused) Pain management: pain level controlled Vital Signs Assessment: post-procedure vital signs reviewed and stable Respiratory status: spontaneous breathing, nonlabored ventilation, respiratory function stable and patient connected to nasal cannula oxygen Cardiovascular status: blood pressure returned to baseline and stable Postop Assessment: spinal receding Anesthetic complications: no    Last Vitals:  Vitals:   12/15/16 1637 12/15/16 1711  BP: (!) 155/84 111/74  Pulse:  71  Resp: 20 15  Temp:    SpO2:  100%    Last Pain:  Vitals:   12/15/16 1705  TempSrc:   PainSc: 0-No pain                 Donzell Coller,E. Ladell Bey

## 2016-12-15 NOTE — Progress Notes (Signed)
Pharmacy student rounding with IMTP-B2 teaching service observed the following: BM is an 83 y.o. female with a PMH of Dementia and uncontrolled Hypertension. BM BP readings range from 145/61-175/83 while in the hospital. The patient's most recent BP reading was 140/50 which is uncontrolled according to ACC/AHA guidelines (goal <130/80). Home medications for HTN include Amlodipine 10mg  and Hyzaar (dose unknown). She was restarted on home medications Amlodipine 10mg  and Hyzaar (100/12.5) which helped decrease her BP but she is still uncontrolled.  . Consider switching Hyzaar to 100/25mg  to ensure pt reaches BP goal to decrease potential CVD risks that are associated with HTN. Pt does not require renal dosage adjustments due to CrCl >30 ml/min.  . Monitor patient for pedal edema for amlodipine and electrolyte imbalances (Na, K, Mg, Ca) for ARB/thiazide. According to the guidelines, minority patients (African Americans/Hispanics) have more effective lowering on thiazide and CCB than ACEI/ARBs which the pt is on.   Dulce SellarAngela Masae Lukacs, PharmD. Candidate 2019 [facility] [employer]

## 2016-12-16 ENCOUNTER — Encounter (HOSPITAL_COMMUNITY): Payer: Self-pay | Admitting: Orthopaedic Surgery

## 2016-12-16 LAB — CBC
HCT: 34.6 % — ABNORMAL LOW (ref 36.0–46.0)
Hemoglobin: 11 g/dL — ABNORMAL LOW (ref 12.0–15.0)
MCH: 24.6 pg — ABNORMAL LOW (ref 26.0–34.0)
MCHC: 31.8 g/dL (ref 30.0–36.0)
MCV: 77.4 fL — ABNORMAL LOW (ref 78.0–100.0)
Platelets: 283 10*3/uL (ref 150–400)
RBC: 4.47 MIL/uL (ref 3.87–5.11)
RDW: 15.2 % (ref 11.5–15.5)
WBC: 9.2 10*3/uL (ref 4.0–10.5)

## 2016-12-16 LAB — BASIC METABOLIC PANEL WITH GFR
Anion gap: 11 (ref 5–15)
BUN: 9 mg/dL (ref 6–20)
CO2: 26 mmol/L (ref 22–32)
Calcium: 8.9 mg/dL (ref 8.9–10.3)
Chloride: 105 mmol/L (ref 101–111)
Creatinine, Ser: 0.66 mg/dL (ref 0.44–1.00)
GFR calc Af Amer: >60 mL/min
GFR calc non Af Amer: >60 mL/min
Glucose, Bld: 231 mg/dL — ABNORMAL HIGH (ref 65–99)
Potassium: 3.3 mmol/L — ABNORMAL LOW (ref 3.5–5.1)
Sodium: 142 mmol/L (ref 135–145)

## 2016-12-16 NOTE — Progress Notes (Signed)
   Subjective:  Patient is sleeping comfortably.  Objective:   VITALS:   Vitals:   12/15/16 1711 12/15/16 1720 12/15/16 2030 12/16/16 0500  BP: 111/74 117/83 134/78 (!) 163/58  Pulse: 71 69 72 67  Resp: 15 16 15 16   Temp:   97.7 F (36.5 C) 98.6 F (37 C)  TempSrc:   Axillary Oral  SpO2: 100% 98% 93% 96%  Weight:      Height:        Intact pulses distally Incision: dressing C/D/I and no drainage No cellulitis present Compartment soft   Lab Results  Component Value Date   WBC 9.2 12/16/2016   HGB 11.0 (L) 12/16/2016   HCT 34.6 (L) 12/16/2016   MCV 77.4 (L) 12/16/2016   PLT 283 12/16/2016     Assessment/Plan:  1 Day Post-Op   - Expected postop acute blood loss anemia - will monitor for symptoms - Up with PT/OT - DVT ppx - SCDs, ambulation, lovenox - WBAT operative extremity, no hip precautions - Pain control - Discharge planning  Glee ArvinMichael Akeen Ledyard 12/16/2016, 7:25 AM [phone]

## 2016-12-16 NOTE — Progress Notes (Signed)
Progress Note  Patient Name: Shannon Bright Date of Encounter: 12/16/2016  Primary Cardiologist:  New  Subjective   Not verbal  Inpatient Medications    Scheduled Meds: . amLODipine  10 mg Oral Daily  . donepezil  10 mg Oral QHS  . dorzolamide-timolol  1 drop Left Eye BID  . enoxaparin (LOVENOX) injection  40 mg Subcutaneous Q24H  . famotidine  20 mg Oral Daily  . losartan  100 mg Oral Daily   And  . hydrochlorothiazide  12.5 mg Oral Daily  . latanoprost  1 drop Left Eye QHS  . Melatonin  3 mg Oral QHS  . memantine  10 mg Oral BID  . potassium chloride SA  20 mEq Oral Daily  . risperiDONE  0.25 mg Oral QHS  . simvastatin  20 mg Oral Daily   Continuous Infusions: . sodium chloride 125 mL (12/15/16 1745)  .  ceFAZolin (ANCEF) IV 2 g (12/16/16 0529)  . lactated ringers 10 mL/hr at 12/15/16 1345  . lactated ringers    . methocarbamol (ROBAXIN)  IV     PRN Meds: acetaminophen **OR** acetaminophen, alum & mag hydroxide-simeth, HYDROcodone-acetaminophen, menthol-cetylpyridinium **OR** phenol, methocarbamol **OR** methocarbamol (ROBAXIN)  IV, metoCLOPramide **OR** metoCLOPramide (REGLAN) injection, morphine injection, ondansetron **OR** ondansetron (ZOFRAN) IV, oxyCODONE, senna-docusate   Vital Signs    Vitals:   12/15/16 1711 12/15/16 1720 12/15/16 2030 12/16/16 0500  BP: 111/74 117/83 134/78 (!) 163/58  Pulse: 71 69 72 67  Resp: 15 16 15 16   Temp:   97.7 F (36.5 C) 98.6 F (37 C)  TempSrc:   Axillary Oral  SpO2: 100% 98% 93% 96%  Weight:      Height:        Intake/Output Summary (Last 24 hours) at 12/16/16 0739 Last data filed at 12/16/16 0529  Gross per 24 hour  Intake             1320 ml  Output             1325 ml  Net               -5 ml   Filed Weights   12/14/16 0937 12/15/16 1332  Weight: 109 lb (49.4 kg) 109 lb (49.4 kg)    Telemetry    Not on Tele - Personally Reviewed   Physical Exam   GEN: frail, elderly female HEENT: normal  Neck:  no JVD, carotid bruits, or masses Cardiac: RRR. no murmurs, rubs, or gallops,no edema. Intact distal pulses bilaterally.  Respiratory: clear to auscultation bilaterally, normal work of breathing GI: soft, nontender, nondistended, + BS MS: no deformity or atrophy  Skin: warm and dry, no rash Neuro: nonverbal   Labs    Chemistry  Recent Labs Lab 12/13/16 1532 12/15/16 1858 12/16/16 0431  NA 142  --  142  K 3.4*  --  3.3*  CL 105  --  105  CO2 26  --  26  GLUCOSE 137*  --  231*  BUN 18  --  9  CREATININE 0.86 0.74 0.66  CALCIUM 9.6  --  8.9  PROT 7.4  --   --   ALBUMIN 3.7  --   --   AST 17  --   --   ALT 15  --   --   ALKPHOS 45  --   --   BILITOT 0.7  --   --   GFRNONAA >60 >60 >60  GFRAA >60 >60 >60  ANIONGAP 11  --  11     Hematology  Recent Labs Lab 12/13/16 1532 12/15/16 1858 12/16/16 0431  WBC 10.4 14.7* 9.2  RBC 4.49 4.77 4.47  HGB 11.1* 11.8* 11.0*  HCT 34.8* 37.3 34.6*  MCV 77.5* 78.2 77.4*  MCH 24.7* 24.7* 24.6*  MCHC 31.9 31.6 31.8  RDW 15.6* 15.3 15.2  PLT 279 293 283    Radiology    Pelvis Portable  Result Date: 12/15/2016 CLINICAL DATA:  Status post right hip replacement EXAM: PORTABLE PELVIS 1-2 VIEWS COMPARISON:  12/13/2016 FINDINGS: Interval right hip replacement with normal alignment. No acute fracture seen. Dense vascular calcification. Small amount of soft tissue gas. Mild to moderate degenerative changes of the left hip IMPRESSION: Status post right hip arthroplasty with expected postsurgical change Electronically Signed   By: Jasmine Pang M.D.   On: 12/15/2016 17:05   Dg C-arm 1-60 Min  Result Date: 12/15/2016 CLINICAL DATA:  RIGHT hip replacement via anterior approach EXAM: OPERATIVE RIGHT HIP (WITH PELVIS IF PERFORMED) 4 VIEWS TECHNIQUE: Fluoroscopic spot image(s) were submitted for interpretation post-operatively. FLUOROSCOPY TIME:  0 minutes 16 seconds COMPARISON:  12/13/2016 FINDINGS: Osseous demineralization. Images  demonstrate resection of the proximal RIGHT femur and placement of a hip prosthesis. Cerclage wire at the proximal femoral diaphysis. No acute fracture or dislocation. Visualized pelvis intact. Degenerative changes LEFT hip. Scattered atherosclerotic calcifications. IMPRESSION: RIGHT hip replacement without acute abnormalities. Electronically Signed   By: Ulyses Southward M.D.   On: 12/15/2016 16:21   Dg Hip Operative Unilat W Or W/o Pelvis Right  Result Date: 12/15/2016 CLINICAL DATA:  RIGHT hip replacement via anterior approach EXAM: OPERATIVE RIGHT HIP (WITH PELVIS IF PERFORMED) 4 VIEWS TECHNIQUE: Fluoroscopic spot image(s) were submitted for interpretation post-operatively. FLUOROSCOPY TIME:  0 minutes 16 seconds COMPARISON:  12/13/2016 FINDINGS: Osseous demineralization. Images demonstrate resection of the proximal RIGHT femur and placement of a hip prosthesis. Cerclage wire at the proximal femoral diaphysis. No acute fracture or dislocation. Visualized pelvis intact. Degenerative changes LEFT hip. Scattered atherosclerotic calcifications. IMPRESSION: RIGHT hip replacement without acute abnormalities. Electronically Signed   By: Ulyses Southward M.D.   On: 12/15/2016 16:21    Cardiac Studies   ECHOCARDIOGRAM 12/14/2016  Study Conclusions  - Left ventricle: The cavity size was normal. Wall thickness was   increased in a pattern of mild LVH. Systolic function was normal.   The estimated ejection fraction was in the range of 60% to 65%.   Wall motion was normal; there were no regional wall motion   abnormalities. Doppler parameters are consistent with abnormal   left ventricular relaxation (grade 1 diastolic dysfunction). - Aortic valve: Valve mobility was restricted. There was mild   stenosis.  Impressions:  - Normal LV systolic function; mild LVH; mild diastolic   dysfunction; calcified aortic valve with mild AS (mean gradient   10 mmHg).  Patient Profile   Shannon Bright is a 83 y.o.  female with a hx of Hypertension and dementia who is being seen today for the evaluation of cardiac murmur pre operatively for a hip fracture.  Assessment & Plan     Right hip fracture: Post op no cardiac complications   Aortic Stenosis:  Mild aortic stenosis with preserved EF.  Dementia:  On aricept needs SNF   Charlton Haws

## 2016-12-16 NOTE — Progress Notes (Signed)
Internal Medicine Attending:   I saw and examined the patient. I reviewed the resident's note and I agree with the resident's findings and plan as documented in the resident's note. Paitent without complaints today, reports pain controlled, and wants to go back to sleep.  Otherwise she is doing well.  I discussed with [facility] psychotherapistsocial worker, [facility].  Otherwise today we will plan to have her ambulate with PT and then [facility]  Other issues for discharge, will need to continue Lovenox 40mg  daily for at least 14 days post surgery, she should be considered for bisphophonate therapy in about 6 weeks, and will need repeat echocardiogram in about 3-5 years for follow up of mild aortic stenosis.

## 2016-12-16 NOTE — Care Management Important Message (Signed)
Important Message  Patient Details  Name: Shannon Bright MRN: [id number] Date of Birth: 11/19/1931   Medicare Important Message Given:  Yes    Dorena BodoIris Udell Blasingame 12/16/2016, 11:35 AM

## 2016-12-16 NOTE — Evaluation (Signed)
Occupational Therapy Evaluation Patient Details Name: Shannon Bright MRN: [id number] DOB: 11/16/1931 Today's Date: 12/16/2016    History of Present Illness Pt is an 83 y.o. female s/p BIPOLAR HIP HEMIARTHROPLASTY. PMHx: Dementia, HTN.   Clinical Impression   Per pts daughter, pt requires assist for all ADL but able to perform functional mobility without use of AD PTA. Currently pt requires mod assist for UB ADL and max assist for LB ADL. Pt requires max assist +2 for sit to stand from chair with few steps back to chair for repositioning. Recommending SNF for follow up to maximize independence and safety with ADL and functional mobility prior to return home. Pt would benefit from continued skilled OT to address established goals.   Follow Up Recommendations  SNF;Supervision/Assistance - 24 hour    Equipment Recommendations  Other (comment) (TBD at next venue)    Recommendations for Other Services PT consult     Precautions / Restrictions Precautions Precautions: Fall Restrictions Weight Bearing Restrictions: Yes RLE Weight Bearing: Weight bearing as tolerated      Mobility Bed Mobility               General bed mobility comments: Pt OOB in chair upon arrival  Transfers Overall transfer level: Needs assistance Equipment used: 2 person hand held assist Transfers: Sit to/from Stand Sit to Stand: Max assist;+2 physical assistance         General transfer comment: Max assist +2 to boost up from chair x1 and maintain standing balance. Cues throughout for sequencing and initiation    Balance Overall balance assessment: Needs assistance Sitting-balance support: Feet supported;Bilateral upper extremity supported Sitting balance-Leahy Scale: Poor     Standing balance support: Bilateral upper extremity supported Standing balance-Leahy Scale: Poor Standing balance comment: max assist +2 for standing balance                           ADL either performed or  assessed with clinical judgement   ADL Overall ADL's : Needs assistance/impaired Eating/Feeding: Set up;Supervision/ safety;Sitting   Grooming: Minimal assistance;Sitting   Upper Body Bathing: Moderate assistance;Sitting   Lower Body Bathing: Maximal assistance;Sit to/from stand   Upper Body Dressing : Moderate assistance;Sitting   Lower Body Dressing: Maximal assistance;Sit to/from stand               Functional mobility during ADLs: Maximal assistance;+2 for physical assistance (HHA for sit to stand with few steps)       Vision Baseline Vision/History: Glaucoma       Perception     Praxis      Pertinent Vitals/Pain Pain Assessment: Faces Faces Pain Scale: Hurts even more Pain Location: R hip with weight bearing Pain Descriptors / Indicators: Aching;Sore Pain Intervention(s): Monitored during session;Limited activity within patient's tolerance;Ice applied     Hand Dominance     Extremity/Trunk Assessment Upper Extremity Assessment Upper Extremity Assessment: Generalized weakness   Lower Extremity Assessment Lower Extremity Assessment: Defer to PT evaluation   Cervical / Trunk Assessment Cervical / Trunk Assessment: Kyphotic   Communication Communication Communication: No difficulties   Cognition Arousal/Alertness: Lethargic;Suspect due to medications Behavior During Therapy: Flat affect Overall Cognitive Status: History of cognitive impairments - at baseline                                     General Comments  Exercises     Shoulder Instructions      Home Living Family/patient expects to be discharged to:: Skilled nursing facility Living Arrangements: Children                               Additional Comments: Attends [facility] days per week      Prior Functioning/Environment Level of Independence: Needs assistance  Gait / Transfers Assistance Needed: Supervision for mobility without use of AD ADL's /  Homemaking Assistance Needed: Assist for ADL from daughter            OT Problem List: Decreased strength;Decreased range of motion;Decreased activity tolerance;Impaired balance (sitting and/or standing);Impaired vision/perception;Decreased cognition;Decreased safety awareness;Decreased knowledge of use of DME or AE;Decreased knowledge of precautions;Pain      OT Treatment/Interventions: Self-care/ADL training;Energy conservation;Therapeutic activities;Patient/family education;Balance training;Therapeutic exercise    OT Goals(Current goals can be found in the care plan section) Acute Rehab OT Goals Patient Stated Goal: none stated OT Goal Formulation: With patient/family Time For Goal Achievement: 12/30/16 Potential to Achieve Goals: Good ADL Goals Pt Will Transfer to Toilet: with min assist;bedside commode;stand pivot transfer Pt Will Perform Toileting - Clothing Manipulation and hygiene: with min assist;sit to/from stand Additional ADL Goal #1: Pt will perform bed mobility with min assist as precursor to ADL.  OT Frequency: Min 2X/week   Barriers to D/C:            Co-evaluation              AM-PAC PT "6 Clicks" Daily Activity     Outcome Measure Help from another person eating meals?: A Little Help from another person taking care of personal grooming?: A Little Help from another person toileting, which includes using toliet, bedpan, or urinal?: A Lot Help from another person bathing (including washing, rinsing, drying)?: A Lot Help from another person to put on and taking off regular upper body clothing?: A Lot Help from another person to put on and taking off regular lower body clothing?: A Lot 6 Click Score: 14   End of Session Equipment Utilized During Treatment: Gait belt Nurse Communication: Mobility status;Weight bearing status;Other (comment) (removed purewick)  Activity Tolerance: Patient tolerated treatment well Patient left: in chair;with call bell/phone  within reach;with family/visitor present  OT Visit Diagnosis: Unsteadiness on feet (R26.81);Other abnormalities of gait and mobility (R26.89);Muscle weakness (generalized) (M62.81);Pain Pain - Right/Left: Right Pain - part of body: Hip                Time: 2130-8657 OT Time Calculation (min): 20 min Charges:  OT General Charges $OT Visit: 1 Visit OT Evaluation $OT Eval Moderate Complexity: 1 Mod G-Codes:     Kynsli Haapala A. Brett Albino, M.S., OTR/L Pager: [phone]  Gaye Alken 12/16/2016, 12:05 PM

## 2016-12-16 NOTE — Progress Notes (Signed)
   Subjective: Pt underwent surgical repair of R hip yesterday, tolerated the procedure well with no immediate complications. She is resting comfortably this morning with no complaints of pain. At her baseline mental status, daughter was not in room during rounds    Objective:  Vital signs in last 24 hours: Vitals:   12/15/16 1711 12/15/16 1720 12/15/16 2030 12/16/16 0500  BP: 111/74 117/83 134/78 (!) 163/58  Pulse: 71 69 72 67  Resp: 15 16 15 16   Temp:   97.7 F (36.5 C) 98.6 F (37 C)  TempSrc:   Axillary Oral  SpO2: 100% 98% 93% 96%  Weight:      Height:       Physical Exam  Constitutional:  Frail elderly woman resting in bed, no acute distress   HENT:  Head: Normocephalic and atraumatic.  Cardiovascular: Normal rate and regular rhythm.   Systolic murmur   Pulmonary/Chest: Effort normal. No respiratory distress.  Abdominal: Soft. There is no tenderness.  Musculoskeletal: She exhibits no edema or deformity.  Dressings in place   Neurological:  Alert and oriented to self only c/w baseline, interactive   Skin: Skin is warm and dry.    Assessment/Plan:  R Hip Fracture, Osteoporosis  Pt presented with impacted subcapital hip fracture s/p mechanical fall. Expectant management for recovery/prognosis discussed as an outpatient with orthopedic surgery. Surgery was delayed for pre-operative evaluation of cardiac murmur. She is not currently complaining of pain. She had a normal DEXA scan in 2015, however this fracture d/t fall from standing height qualifies her for osteoporosis diagnosis, would benefit from bisphosphonate therapy after acute peri-operative period.  --Neurovascular checks --PT/OT   --Pain Control: Oxycodone 5-10 mg, q4hr PRN, Morphine 0.5 mg q2hr PRN   --SW for SNF placement   Mild Aortic Stenosis  Pt with 2/6 systolic murmur on exam, daughter reports [facility] physicians were aware of murmur, no available echo in chart review. Echo here revealed mild aortic  stenosis, EF 60-65%, no contra-indication to surgery. Can consider f/u echo in several years or if pt develops sx.      Dementia At baseline pt is unable to complete ADLs, is only able to recognize her daughter and is less interactive with other people. Her daughter is primary caregiver. She denies a history of sun-downing or delirium but notes she may be confused if she (daughter) is not present. Currently at baseline mental status  --Continue home donepezil, risperidone --Delirium precautions    Dispo: Anticipated discharge in approximately 1-2 day(s).   Ginger CarneHarden, Churchill Grimsley, MD 12/16/2016, 11:33 AM Pager: [phone]

## 2016-12-16 NOTE — Evaluation (Signed)
Physical Therapy Evaluation Patient Details Name: Shannon StagersBertha G Esther MRN: [id number] DOB: 05/15/1931 Today's Date: 12/16/2016   History of Present Illness  Pt is an 83 y.o. female s/p bipolar hip hemiarthroplasty with direct anterior approach. Pertinent PMH includes dementia, HTN, heart murmur.     Clinical Impression  Pt presents with an overall decrease in functional mobility secondary to above. Per pt's daughter, pt with history of dementia and requires assist for all transfers, amb with no AD, and ADLs. Educ pt and daughter on precautions, positioning, and importance of mobility. Today, pt unable to stand secondary to significant pain and resistanct guarding with mobility; able to perform seated RLE therex and AAROM. Pt would benefit from continued acute PT services with to maximize functional mobility and independence prior to d/c with SNF-level therapies.     Follow Up Recommendations SNF;Supervision/Assistance - 24 hour    Equipment Recommendations  None recommended by PT (defer to next venue)    Recommendations for Other Services       Precautions / Restrictions Precautions Precautions: Fall Restrictions Weight Bearing Restrictions: Yes RLE Weight Bearing: Weight bearing as tolerated      Mobility  Bed Mobility               General bed mobility comments: Pt OOB in chair upon arrival. Able to scoot to edge of chair with min guard in order to perform seated therex.  Transfers Overall transfer level: Needs assistance Equipment used: 2 person hand held assist Transfers: Sit to/from Stand Sit to Stand: Max assist         General transfer comment: Pt unable to stand with maxA secondary to pushing away from PT with every attempt with c/o R hip pain  Ambulation/Gait                Stairs            Wheelchair Mobility    Modified Rankin (Stroke Patients Only)       Balance Overall balance assessment: Needs assistance Sitting-balance support: Feet  supported;No upper extremity supported Sitting balance-Leahy Scale: Fair     Standing balance support: Bilateral upper extremity supported Standing balance-Leahy Scale: Poor Standing balance comment: max assist +2 for standing balance                             Pertinent Vitals/Pain Pain Assessment: Faces Faces Pain Scale: Hurts even more Pain Location: R hip with motion Pain Descriptors / Indicators: Aching;Sore;Grimacing;Guarding Pain Intervention(s): Limited activity within patient's tolerance;Monitored during session;Ice applied    Home Living Family/patient expects to be discharged to:: Skilled nursing facility Living Arrangements: Children               Additional Comments: Attends [facility] days per [facility]    Prior Function Level of Independence: Needs assistance   Gait / Transfers Assistance Needed: Supervision for mobility without use of AD  ADL's / Homemaking Assistance Needed: Assist for ADL from daughter        Hand Dominance        Extremity/Trunk Assessment   Upper Extremity Assessment Upper Extremity Assessment: Generalized weakness    Lower Extremity Assessment Lower Extremity Assessment: Generalized weakness;RLE deficits/detail RLE Deficits / Details: S/p R THA; knee ext/flex grossly 3/5    Cervical / Trunk Assessment Cervical / Trunk Assessment: Kyphotic  Communication   Communication: No difficulties  Cognition Arousal/Alertness: Lethargic;Suspect due to medications Behavior During Therapy: Flat affect Overall  Cognitive Status: History of cognitive impairments - at baseline                                 General Comments: Pt with significant dementia at baseline; daughter present and reports no change with this. At times appropriately responsive with repeated cues, but does not follow majority of commands      General Comments      Exercises Total Joint Exercises Ankle Circles/Pumps: AROM;Both;10  reps;Seated Heel Slides: AAROM;Right;5 reps;Seated Long Arc Quad: AAROM;Right;Seated;10 reps Knee Flexion: AAROM;Right;10 reps;Seated Goniometric ROM: Hamstring, quadricep, and calf stretch for RLE   Assessment/Plan    PT Assessment Patient needs continued PT services  PT Problem List Decreased strength;Decreased range of motion;Decreased activity tolerance;Decreased balance;Decreased mobility;Decreased cognition;Decreased knowledge of use of DME;Decreased safety awareness;Decreased knowledge of precautions;Decreased skin integrity;Pain       PT Treatment Interventions DME instruction;Gait training;Stair training;Functional mobility training;Therapeutic activities;Therapeutic exercise;Balance training;Patient/family education    PT Goals (Current goals can be found in the Care Plan section)  Acute Rehab PT Goals Patient Stated Goal: none stated PT Goal Formulation: With patient Time For Goal Achievement: 12/30/16    Frequency 7X/week   Barriers to discharge        Co-evaluation               AM-[facility] PT "6 Clicks" Daily Activity  Outcome Measure Difficulty turning over in bed (including adjusting bedclothes, sheets and blankets)?: Unable Difficulty moving from lying on back to sitting on the side of the bed? : Unable Difficulty sitting down on and standing up from a chair with arms (e.g., wheelchair, bedside commode, etc,.)?: Unable Help needed moving to and from a bed to chair (including a wheelchair)?: A Lot Help needed walking in hospital room?: A Lot Help needed climbing 3-5 steps with a railing? : Total 6 Click Score: 8    End of Session Equipment Utilized During Treatment: Gait belt Activity Tolerance: Patient tolerated treatment well Patient left: in chair;with call bell/phone within reach;Other (comment);with family/visitor present (bilat soft mitts on) Nurse Communication: Mobility status PT Visit Diagnosis: Other abnormalities of gait and mobility  (R26.89);Pain Pain - Right/Left: Right Pain - part of body: Hip    Time: 9604-5409 PT Time Calculation (min) (ACUTE ONLY): 19 min   Charges:   PT Evaluation $PT Eval Moderate Complexity: 1 Mod     PT G Codes:       Ina Homes, PT, DPT Acute Rehab Services  Pager: [phone]  Malachy Chamber 12/16/2016, 2:46 PM

## 2016-12-17 LAB — BASIC METABOLIC PANEL
ANION GAP: 7 (ref 5–15)
BUN: 6 mg/dL (ref 6–20)
CHLORIDE: 105 mmol/L (ref 101–111)
CO2: 27 mmol/L (ref 22–32)
CREATININE: 0.62 mg/dL (ref 0.44–1.00)
Calcium: 8.4 mg/dL — ABNORMAL LOW (ref 8.9–10.3)
GFR calc non Af Amer: >60 mL/min (ref >60)
GLUCOSE: 143 mg/dL — AB (ref 65–99)
Potassium: 2.8 mmol/L — ABNORMAL LOW (ref 3.5–5.1)
Sodium: 139 mmol/L (ref 135–145)

## 2016-12-17 LAB — CBC
HEMATOCRIT: 31.2 % — AB (ref 36.0–46.0)
HEMOGLOBIN: 9.7 g/dL — AB (ref 12.0–15.0)
MCH: 24 pg — ABNORMAL LOW (ref 26.0–34.0)
MCHC: 31.1 g/dL (ref 30.0–36.0)
MCV: 77 fL — AB (ref 78.0–100.0)
Platelets: 254 10*3/uL (ref 150–400)
RBC: 4.05 MIL/uL (ref 3.87–5.11)
RDW: 15.1 % (ref 11.5–15.5)
WBC: 10.6 10*3/uL — ABNORMAL HIGH (ref 4.0–10.5)

## 2016-12-17 MED ORDER — POTASSIUM CHLORIDE CRYS ER 20 MEQ PO TBCR
40.0000 meq | EXTENDED_RELEASE_TABLET | Freq: Every day | ORAL | Status: DC
  Administered 2016-12-17 – 2016-12-19 (×3): 40 meq via ORAL
  Filled 2016-12-17 (×3): qty 2

## 2016-12-17 MED ORDER — POTASSIUM CHLORIDE 10 MEQ/100ML IV SOLN
10.0000 meq | INTRAVENOUS | Status: AC
  Administered 2016-12-17 (×4): 10 meq via INTRAVENOUS
  Filled 2016-12-17 (×4): qty 100

## 2016-12-17 NOTE — Clinical Social Work Note (Signed)
Clinical Social Work Assessment  Patient Details  Name: Shannon Bright MRN: [id number] Date of Birth: 09/20/1931  Date of referral:  12/17/16               Reason for consult:  Facility Placement, Discharge Planning                Permission sought to share information with:  Case Manager, Facility Medical sales representativeContact Representative, Family Supports Permission granted to share information::     Name::        Agency::  SNF bed search  Relationship::  Patient's sister at bedside  Contact Information:     Housing/Transportation Living arrangements for the past 2 months:  Single Family Home Source of Information:  Other (Comment Required), Medical Team, Case Manager (Sister) Patient Interpreter Needed:  None Criminal Activity/Legal Involvement Pertinent to Current Situation/Hospitalization:  No - Comment as needed Significant Relationships:  Other Family Members, Siblings Lives with:  Self Do you feel safe going back to the place where you live?  No Need for family participation in patient care:  Yes (Comment) (Dementia)  Care giving concerns:  Yesterday, her daughter was changing her briefs in her bedroom when the pt became off balance and fell on to her right side. She had no complaints of light-headedness or palpitations prior to the fall, no LOC. She did not complain of any hip pain following the fall or this morning but the daughter noticed that she was not walking. She did note moving her right lower extremity spontaneously. At baseline she is mobile without a cane or walker, can walk up stairs. She denies a history of prior falls. Denies dysuria but notes her urine smelled strong yesterday, her daughter gave her a dose of Cipro left over from prior prescription. She denies other symptoms including generalized fatigue or weakness, chest pain, shortness of breath, n/v/d, and she has had normal PO intake. She does not perform her ADLs, can feed herself with encouragement, only recognizes her daughter  and her participation in conversation is mostly with her daughter per her report.  [facility] of [facility] program, patient lives at home and will require short term rehab at discharge.   Social Worker assessment / plan:  LCSW consulted for discharge planning SNF placement. Patient and family agreeable to SNF workup.   SNF workup completed. LCSW to follow up on Monday for authorization from [facility].  Plan: SNF   Employment status:  Retired Health and safety inspectornsurance information:  Other (Comment Required) ([facility]) PT Recommendations:  Skilled Nursing Facility Information / Referral to community resources:  Skilled Nursing Facility  Patient/Family's Response to care:  Agreeable to plan, sister at bedside.  Patient/Family's Understanding of and Emotional Response to Diagnosis, Current Treatment, and Prognosis:  Sister voices understanding of recommendations for SNF, optimistic for quick recovery and return to home.  Emotional Assessment Appearance:  Appears stated age Attitude/Demeanor/Rapport:    Affect (typically observed):  Calm, Other (Patient sleeping) Orientation:  Oriented to Self Alcohol / Substance use:  Not Applicable Psych involvement (Current and /or in the community):  No (Comment)  Discharge Needs  Concerns to be addressed:  No discharge needs identified Readmission within the last 30 days:  No Current discharge risk:  None Barriers to Discharge:  No Barriers Identified   Shannon SizerCarol L Jamir Rone, LCSW 12/17/2016, 2:50 PM

## 2016-12-17 NOTE — Progress Notes (Signed)
   Subjective: No acute events overnight, given tylenol throughout the day yesterday as pt does not readily complain of pain. Worked with PT. She is awaiting placement to SNF for further rehab.   Objective:  Vital signs in last 24 hours: Vitals:   12/16/16 0500 12/16/16 1441 12/16/16 2005 12/17/16 0549  BP: (!) 163/58 (!) 143/58 (!) 169/57 (!) 159/52  Pulse: 67 64 68 74  Resp: 16 16 16 15   Temp: 98.6 F (37 C) 99.5 F (37.5 C) 99 F (37.2 C) (!) 97.5 F (36.4 C)  TempSrc: Oral Oral Axillary Axillary  SpO2: 96% 96% 96% 100%  Weight:      Height:       Physical Exam  Constitutional:  Frail elderly woman sleeping in bed and arousable, no acute distress   HENT:  Head: Normocephalic and atraumatic.  Cardiovascular: Normal rate and regular rhythm.   Systolic murmur   Pulmonary/Chest: Effort normal. No respiratory distress.  Abdominal: Soft. There is no tenderness.  Musculoskeletal: She exhibits no edema or deformity.  Dressings in place   Neurological:  Alert and oriented to self only c/w baseline, interactive   Skin: Skin is warm and dry.    Assessment/Plan:  R Hip Fracture, Osteoporosis  Pt presented with impacted subcapital hip fracture s/p mechanical fall. Expectant management for recovery/prognosis discussed as an outpatient with orthopedic surgery. Surgery was delayed for pre-operative evaluation of cardiac murmur. She is not currently complaining of pain. She had a normal DEXA scan in 2015, however this fracture d/t fall from standing height qualifies her for osteoporosis diagnosis, would benefit from bisphosphonate therapy after acute peri-operative period. Awaiting SNF placement to continue rehab.   --Neurovascular checks --Continue PT/OT   --Pain Control: Tylenol, Oxycodone 5-10 mg, q4hr PRN, Morphine 0.5 mg q2hr PRN   --Continue Lovenox  Dementia At baseline pt is unable to complete ADLs, is only able to recognize her daughter and is less interactive with other  people. Her daughter is primary caregiver. She denies a history of sun-downing or delirium but notes she may be confused if she (daughter) is not present. Currently at baseline mental status  --Continue home donepezil, risperidone --Delirium precautions    Dispo: Anticipated discharge in approximately 1-2 day(s).   Ginger CarneHarden, Koray Soter, MD 12/17/2016, 7:04 AM Pager: [phone]

## 2016-12-17 NOTE — Progress Notes (Signed)
Medicine attending: Clinical status reviewed with resident physician Dr. Ginger CarneKyler Harden and I concur with his evaluation and management plan.  She is recuperating uneventfully from recent traumatic right hip fracture with subsequent hip arthroplasty done on September 6.  She is on Lovenox anticoagulation although she has normal renal function and would be a good candidate for an oral anticoagulant. Vital signs are stable. She has developed hypokalemia and potassium will be replaced.

## 2016-12-17 NOTE — Progress Notes (Signed)
Patient stable. Leg lengths equal and dorsiflexion of both ankles intact Mobilization to continue and placement is pending

## 2016-12-17 NOTE — Discharge Summary (Signed)
Name: Shannon Bright MRN: [id number] DOB: 1932-02-24 83 y.o. PCP: System, Pcp Not In  Date of Admission: 12/13/2016  2:30 PM Date of Discharge: 12/19/2016 Attending Physician: Gust Rung, DO  Discharge Diagnosis: Active Problems:   Closed displaced fracture of right femoral neck (HCC)   Mild aortic valve stenosis   Dementia   Osteoporosis   Discharge Medications: Allergies as of 12/19/2016   No Known Allergies     Medication List    STOP taking these medications   ciprofloxacin 250 MG tablet Commonly known as:  CIPRO     TAKE these medications   acetaminophen 325 MG tablet Commonly known as:  TYLENOL Take 650 mg by mouth every 6 (six) hours as needed for mild pain.   alum & mag hydroxide-simeth 200-200-20 MG/5ML suspension Commonly known as:  MAALOX/MYLANTA Take 30 mLs by mouth every 4 (four) hours as needed for indigestion.   amLODipine 10 MG tablet Commonly known as:  NORVASC Take 10 mg by mouth daily.   aspirin EC 81 MG tablet Take 81 mg by mouth daily.   donepezil 10 MG tablet Commonly known as:  ARICEPT Take 10 mg by mouth at bedtime.   dorzolamide-timolol 22.3-6.8 MG/ML ophthalmic solution Commonly known as:  COSOPT Place 1 drop into the left eye 2 (two) times daily.   enoxaparin 40 MG/0.4ML injection Commonly known as:  LOVENOX Inject 0.4 mLs (40 mg total) into the skin daily.   enoxaparin 40 MG/0.4ML injection Commonly known as:  LOVENOX Inject 0.4 mLs (40 mg total) into the skin daily.   HYDROcodone-acetaminophen 10-325 MG tablet Commonly known as:  NORCO Take 1-2 tablets by mouth every 6 (six) hours as needed for moderate pain or severe pain.   latanoprost 0.005 % ophthalmic solution Commonly known as:  XALATAN Place 1 drop into the left eye at bedtime.   losartan-hydrochlorothiazide 100-12.5 MG tablet Commonly known as:  HYZAAR Take 1 tablet by mouth daily.   Melatonin 1 MG Tabs Take 1 mg by mouth at bedtime.   memantine 10 MG  tablet Commonly known as:  NAMENDA Take 10 mg by mouth 2 (two) times daily.   potassium chloride 10 MEQ tablet Commonly known as:  K-DUR Take 20 mEq by mouth daily.   ranitidine 150 MG tablet Commonly known as:  ZANTAC Take 150 mg by mouth 2 (two) times daily.   risperiDONE 0.25 MG tablet Commonly known as:  RISPERDAL Take 0.25 mg by mouth at bedtime.   simvastatin 20 MG tablet Commonly known as:  ZOCOR Take 20 mg by mouth daily.            Discharge Care Instructions        Start     Ordered   12/19/16 0000  alum & mag hydroxide-simeth (MAALOX/MYLANTA) 200-200-20 MG/5ML suspension  Every 4 hours PRN     12/19/16 1241   12/19/16 0000  enoxaparin (LOVENOX) 40 MG/0.4ML injection  Every 24 hours     12/19/16 1241   12/19/16 0000  Increase activity slowly     12/19/16 1241   12/19/16 0000  Diet - low sodium heart healthy     12/19/16 1241   12/15/16 0000  enoxaparin (LOVENOX) 40 MG/0.4ML injection  Daily     12/15/16 1612   12/15/16 0000  Weight bearing as tolerated     12/15/16 1612   12/15/16 0000  HYDROcodone-acetaminophen (NORCO) 10-325 MG tablet  Every 6 hours PRN     12/15/16 1612  Disposition and follow-up:   [name] was discharged from [facility] in Stable condition.  At the hospital follow up visit please address:  1.  -Continue physical therapy following hip surgery -Betadine to incision twice daily once dressing is removed on POD#7 -Start bisphosphonate therapy at 6 weeks post-op (around 10/18) for osteoporosis  -F/u with orthopedic surgery in 2 weeks for suture removal and 6 weeks for re-evaluation by surgeon -Long term follow up: Consider repeat echo in 3-5 years to re-eval aortic stenosis    2.  Labs / imaging needed at time of follow-up: None  3.  Pending labs/ test needing follow-up: None  Follow-up Appointments:  Contact information for follow-up providers    Tarry Kos, MD In 2 weeks.   Specialty:   Orthopedic Surgery Why:  For suture removal, For wound re-check Contact information: [address] [place] [address] [phone]            Contact information for after-discharge care    Destination    [facility] .   Specialty:  Skilled Nursing Facility Contact information: [address] [place] [address] [phone]                  Hospital Course by problem list:   R Hip Fracture, Osteoporosis Pt suffered a mechanical fall from standing home during a clothing change with her daughter. She initially did not complain of pain but the following day was not as mobile as her baseline. She was seen at an outpatient orthopedic clinic where X-rays revealed R subcapital hip fracture, she was subsequently admitted for surgical repair following pre-operative echo (see below). She tolerated the procedure well with no complications. She began working with PT on post-op day 1 with no hip precautions and weight-bearing as tolerated. [facility] and SW worked to place the pt at a SNF to continue rehab.   She had a normal DEXA scan in 2015, however, this fracture d/t fall from standing height indicates diagnosis of osteoporosis. She will benefit from initiation of bisphosphonate therapy in about 6 weeks after the peri-operative period.    Dementia At baseline, pt is unable to complete ADLs but was mobile prior to above fracture. She is only able to recognize her daughter and is less interactive with others, though she is very pleasant. Her daughter is her primary caregiver. She remained at her baseline mental status for the duration of the admission, home donepezil, risperidone were continued.     Aortic Stenosis Systolic murmur was identified and evaluated with echo prior to surgery. Echo showed mild aortic stenosis, no contraindication to surgery. Follow up echo could be considered in the future (3-5 years) or if the patient  develops symptoms but will likely be inconsequential.    Discharge Vitals:   BP (!) 147/51 (BP Location: Left Arm)   Pulse 86   Temp 98.1 F (36.7 C) (Axillary)   Resp 16   Ht  (1.626 m)   Wt 109 lb (49.4 kg)   SpO2 100%   BMI 18.71 kg/m   Pertinent Labs, Studies, and Procedures:  R Hip Hemiarthroplasty: "A capsulotomy was performed and the capsular flaps tagged for later repair.  Fluoroscopy was utilized to prepare for the femoral neck cut. The neck osteotomy was performed. The femoral head was removed and found a 44 mm head was the appropriate fit. The fracture was stable but I felt it was best to place a  cable just below the lesser trochanter to keep the fracture from propagating.  Sequential broaching performed up to a size 10. Trial neck and head were placed. The leg was brought back up to neutral and the construct reduced. The position and sizing of components, offset and leg lengths were checked using fluoroscopy. Stability of the construct was checked in extension and external rotation without any subluxation or impingement of prosthesis."  TTE:  Normal LV systolic function; mild LVH; mild diastolic dysfunction; calcified aortic valve with mild AS (mean gradient 10 mmHg).    Discharge Instructions: Discharge Instructions    Diet - low sodium heart healthy    Complete by:  As directed    Increase activity slowly    Complete by:  As directed    Weight bearing as tolerated    Complete by:  As directed       Signed: Arnetha CourserAmin, Aries Kasa, MD 12/19/2016, 12:42 PM   Pager: [phone]

## 2016-12-17 NOTE — Clinical Social Work Placement (Addendum)
3:32 PM LCSW spoke with [facility] on Sunday, discussed bed offer to [facility] and [facility] is in agreement. Bed selected in [facility].  [facility]  CLINICAL SOCIAL WORK PLACEMENT  NOTE  Date:  12/17/2016  Patient Details  Name: Shannon StagersBertha G Mechling MRN: [id number] Date of Birth: 02/12/1932  Clinical Social Work is seeking post-discharge placement for this patient at the Skilled  Nursing Facility level of care (*CSW will initial, date and re-position this form in  chart as items are completed):  Yes   Patient/family provided with [facility] Clinical Social Work Department's list of facilities offering this level of care within the geographic area requested by the patient (or if unable, by the patient's family).  Yes   Patient/family informed of their freedom to choose among providers that offer the needed level of care, that participate in Medicare, Medicaid or managed care program needed by the patient, have an available bed and are willing to accept the patient.  Yes   Patient/family informed of [facility]'s ownership interest in [facility] and [facility], as well as of the fact that they are under no obligation to receive care at these facilities.  PASRR submitted to EDS on 12/17/16     PASRR number received on 12/17/16     Existing PASRR number confirmed on       FL2 transmitted to all facilities in geographic area requested by pt/family on 12/17/16     FL2 transmitted to all facilities within larger geographic area on       Patient informed that his/her managed care company has contracts with or will negotiate with certain facilities, including the following:            Patient/family informed of bed offers received. 12/18/2016   [facility]    Physician recommends and patient [facility]  Patient to be transferred to   on  .  Patient to be transferred to facility by       Patient family  notified on   of transfer.  Name of family member notified:        PHYSICIAN Please sign FL2     Additional Comment:    _______________________________________________ Raye Sorrowoble, Nahun Kronberg N, LCSW 12/17/2016, 12:08 PM

## 2016-12-17 NOTE — [place] FL2 (Signed)
MEDICAID FL2 LEVEL OF CARE SCREENING TOOL     IDENTIFICATION  Patient Name: Shannon Bright Birthdate: 08-16-1931 Sex: female Admission Date (Current Location): 12/13/2016  [facility] and [place] Number:  Producer, television/film/video and Address:  The . [facility], [address] [place], [place] [address]      Provider Number: [id number]  Attending Physician Name and Address:  Gust Rung, DO  Relative Name and Phone Number:       Current Level of Care: Hospital Recommended Level of Care: Skilled Nursing Facility Prior Approval Number:    Date Approved/Denied:   PASRR Number:   [id number]   Discharge Plan: SNF    Current Diagnoses: Patient Active Problem List   Diagnosis Date Noted  . Mild aortic valve stenosis 12/14/2016  . Diastolic dysfunction without heart failure 12/14/2016  . Dementia 12/14/2016  . Osteoporosis 12/14/2016  . Atherosclerotic peripheral vascular disease (HCC) 12/14/2016  . Closed displaced fracture of right femoral neck (HCC) 12/13/2016    Orientation RESPIRATION BLADDER Height & Weight     Self  Normal Incontinent, External catheter Weight: 109 lb (49.4 kg) Height:   (162.6 cm)  BEHAVIORAL SYMPTOMS/MOOD NEUROLOGICAL BOWEL NUTRITION STATUS   Calm, cooperative   Dementia Incontinent Diet (Regular)  AMBULATORY STATUS COMMUNICATION OF NEEDS Skin   Extensive Assist Verbally Surgical wounds, Skin abrasions, Bruising                       Personal Care Assistance Level of Assistance  Bathing, Feeding, Dressing Bathing Assistance: Maximum assistance Feeding assistance: Limited assistance Dressing Assistance: Maximum assistance     Functional Limitations Info  Sight, Hearing, Speech Sight Info: Adequate Hearing Info: Adequate Speech Info: Adequate    SPECIAL CARE FACTORS FREQUENCY  PT (By licensed PT), OT (By licensed OT)     PT Frequency: 5x a week OT Frequency: 5x a week             Contractures Contractures Info: Not present    Additional Factors Info  Code Status, Allergies Code Status Info: Full Code Allergies Info: NKA           Current Medications (12/17/2016):  This is the current hospital active medication list Current Facility-Administered Medications  Medication Dose Route Frequency Provider Last Rate Last Dose  . 0.9 %  sodium chloride infusion   Intravenous Continuous Tarry Kos, MD 125 mL/hr at 12/17/16 0756    . acetaminophen (TYLENOL) tablet 650 mg  650 mg Oral Q6H PRN Tarry Kos, MD   650 mg at 12/17/16 0941   Or  . acetaminophen (TYLENOL) suppository 650 mg  650 mg Rectal Q6H PRN Tarry Kos, MD      . alum & mag hydroxide-simeth (MAALOX/MYLANTA) 200-200-20 MG/5ML suspension 30 mL  30 mL Oral Q4H PRN Tarry Kos, MD      . amLODipine (NORVASC) tablet 10 mg  10 mg Oral Daily Arnetha Courser, MD   10 mg at 12/17/16 0942  . donepezil (ARICEPT) tablet 10 mg  10 mg Oral QHS Arnetha Courser, MD   10 mg at 12/16/16 2226  . dorzolamide-timolol (COSOPT) 22.3-6.8 MG/ML ophthalmic solution 1 drop  1 drop Left Eye BID Arnetha Courser, MD   1 drop at 12/17/16 0948  . enoxaparin (LOVENOX) injection 40 mg  40 mg Subcutaneous Q24H Tarry Kos, MD   40 mg at 12/16/16 2227  . famotidine (PEPCID) tablet 20 mg  20 mg Oral Daily Ginger CarneHarden, Kyler, MD   20 mg at 12/17/16 0941  . losartan (COZAAR) tablet 100 mg  100 mg Oral Daily Carlynn PurlHoffman, Erik C, DO   100 mg at 12/17/16 0941   And  . hydrochlorothiazide (MICROZIDE) capsule 12.5 mg  12.5 mg Oral Daily Carlynn PurlHoffman, Erik C, DO   12.5 mg at 12/17/16 [id number]  . lactated ringers infusion   Intravenous Continuous Sharee HolsterMassagee, Terry, MD 10 mL/hr at 12/15/16 1345    . lactated ringers infusion   Intravenous Continuous Arta Brucessey, Kevin, MD      . latanoprost (XALATAN) 0.005 % ophthalmic solution 1 drop  1 drop Left Eye Lind CovertQHS Amin, Sumayya, MD   1 drop at 12/16/16 2227  . Melatonin TABS 3 mg  3 mg Oral QHS Arnetha CourserAmin, Sumayya, MD   3 mg at 12/16/16  2227  . memantine (NAMENDA) tablet 10 mg  10 mg Oral BID Arnetha CourserAmin, Sumayya, MD   10 mg at 12/17/16 0942  . menthol-cetylpyridinium (CEPACOL) lozenge 3 mg  1 lozenge Oral PRN Tarry KosXu, Naiping M, MD       Or  . phenol (CHLORASEPTIC) mouth spray 1 spray  1 spray Mouth/Throat PRN Tarry KosXu, Naiping M, MD      . methocarbamol (ROBAXIN) tablet 500 mg  500 mg Oral Q6H PRN Tarry KosXu, Naiping M, MD       Or  . methocarbamol (ROBAXIN) 500 mg in dextrose 5 % 50 mL IVPB  500 mg Intravenous Q6H PRN Tarry KosXu, Naiping M, MD      . metoCLOPramide (REGLAN) tablet 5-10 mg  5-10 mg Oral Q8H PRN Tarry KosXu, Naiping M, MD       Or  . metoCLOPramide (REGLAN) injection 5-10 mg  5-10 mg Intravenous Q8H PRN Tarry KosXu, Naiping M, MD      . morphine 4 MG/ML injection 0.52 mg  0.52 mg Intravenous Q2H PRN Tarry KosXu, Naiping M, MD      . ondansetron [facility](ZOFRAN) tablet 4 mg  4 mg Oral Q6H PRN Tarry KosXu, Naiping M, MD       Or  . ondansetron [facility](ZOFRAN) injection 4 mg  4 mg Intravenous Q6H PRN Tarry KosXu, Naiping M, MD      . oxyCODONE (Oxy IR/ROXICODONE) immediate release tablet 5-10 mg  5-10 mg Oral Q4H PRN Tarry KosXu, Naiping M, MD      . potassium chloride 10 mEq in 100 mL IVPB  10 mEq Intravenous Q1 Hr x 4 Ginger CarneHarden, Kyler, MD 100 mL/hr at 12/17/16 1144 10 mEq at 12/17/16 1144  . potassium chloride SA (K-DUR,KLOR-CON) CR tablet 40 mEq  40 mEq Oral Daily Ginger CarneHarden, Kyler, MD   40 mEq at 12/17/16 0941  . risperiDONE (RISPERDAL) tablet 0.25 mg  0.25 mg Oral QHS Arnetha CourserAmin, Sumayya, MD   0.25 mg at 12/16/16 2227  . senna-docusate (Senokot-S) tablet 1 tablet  1 tablet Oral QHS PRN Arnetha CourserAmin, Sumayya, MD      . simvastatin (ZOCOR) tablet 20 mg  20 mg Oral Daily Arnetha CourserAmin, Sumayya, MD   20 mg at 12/17/16 [id number]     Discharge Medications: Please see discharge summary for a list of discharge medications.  Relevant Imaging Results:  Relevant Lab Results:   Additional Information SSN:  [id number]    will need to continue Lovenox 40mg  daily for at least 14 days post surgery  Raye SorrowCoble, Zackarie Chason N, LCSW

## 2016-12-18 DIAGNOSIS — Z79899 Other long term (current) drug therapy: Secondary | ICD-10-CM

## 2016-12-18 DIAGNOSIS — F0390 Unspecified dementia without behavioral disturbance: Secondary | ICD-10-CM

## 2016-12-18 DIAGNOSIS — M80051D Age-related osteoporosis with current pathological fracture, right femur, subsequent encounter for fracture with routine healing: Secondary | ICD-10-CM

## 2016-12-18 DIAGNOSIS — Z96641 Presence of right artificial hip joint: Secondary | ICD-10-CM

## 2016-12-18 DIAGNOSIS — Z9181 History of falling: Secondary | ICD-10-CM

## 2016-12-18 DIAGNOSIS — E876 Hypokalemia: Secondary | ICD-10-CM

## 2016-12-18 LAB — BASIC METABOLIC PANEL
ANION GAP: 7 (ref 5–15)
BUN: <5 mg/dL — ABNORMAL LOW (ref 6–20)
CALCIUM: 8.5 mg/dL — AB (ref 8.9–10.3)
CO2: 29 mmol/L (ref 22–32)
Chloride: 104 mmol/L (ref 101–111)
Creatinine, Ser: 0.59 mg/dL (ref 0.44–1.00)
GFR calc Af Amer: >60 mL/min (ref >60)
GFR calc non Af Amer: >60 mL/min (ref >60)
GLUCOSE: 203 mg/dL — AB (ref 65–99)
Potassium: 3.4 mmol/L — ABNORMAL LOW (ref 3.5–5.1)
Sodium: 140 mmol/L (ref 135–145)

## 2016-12-18 LAB — CBC
HEMATOCRIT: 30.8 % — AB (ref 36.0–46.0)
Hemoglobin: 9.5 g/dL — ABNORMAL LOW (ref 12.0–15.0)
MCH: 23.9 pg — AB (ref 26.0–34.0)
MCHC: 30.8 g/dL (ref 30.0–36.0)
MCV: 77.4 fL — AB (ref 78.0–100.0)
Platelets: 252 10*3/uL (ref 150–400)
RBC: 3.98 MIL/uL (ref 3.87–5.11)
RDW: 15.2 % (ref 11.5–15.5)
WBC: 10.2 10*3/uL (ref 4.0–10.5)

## 2016-12-18 MED ORDER — POLYETHYLENE GLYCOL 3350 17 G PO PACK
17.0000 g | PACK | Freq: Every day | ORAL | Status: DC
  Administered 2016-12-18 – 2016-12-19 (×2): 17 g via ORAL
  Filled 2016-12-18 (×2): qty 1

## 2016-12-18 MED ORDER — SENNOSIDES-DOCUSATE SODIUM 8.6-50 MG PO TABS
2.0000 | ORAL_TABLET | Freq: Two times a day (BID) | ORAL | Status: DC
  Administered 2016-12-18 – 2016-12-19 (×3): 2 via ORAL
  Filled 2016-12-18 (×3): qty 2

## 2016-12-18 NOTE — Progress Notes (Signed)
Medicine attending: I examined this patient today together with resident physician Dr. Tawny Asal and I concur with his evaluation and management plan which we discussed together. 83 year old woman with dementia now postop day 3 following right hip arthroplasty for atraumatic fracture.  Lungs clear.  Regular cardiac rhythm.  No edema or calf tenderness.  Right lateral hip wound dressing dry. Ongoing thromboprophylaxis with Lovenox. Hemoglobin overall stable at 9.5 g. Hypokalemia: Repeat be met pending. Daughter present.  Status and plan reviewed.  Waiting on a rehab bed.

## 2016-12-18 NOTE — Progress Notes (Signed)
   Subjective: No acute events overnight, did not work with PT yesterday. Daughter reports she has been doing well, intermittently complaining of pain though does not always appropriately locate pain to her hip-receives Tylenol with reported relief. She is awaiting SNF placement and will hopefully work with PT again today.    Objective:  Vital signs in last 24 hours: Vitals:   12/17/16 0549 12/17/16 1500 12/17/16 2029 12/18/16 0513  BP: (!) 159/52 (!) 155/49 (!) 142/60 (!) 165/57  Pulse: 74 66 70 64  Resp: 15 16 16 16   Temp: (!) 97.5 F (36.4 C) 98 F (36.7 C) 98.3 F (36.8 C) 98.7 F (37.1 C)  TempSrc: Axillary Axillary Axillary Axillary  SpO2: 100% 99% 99% 99%  Weight:      Height:       Physical Exam  Constitutional:  Frail elderly woman resting in bed, no acute distress   HENT:  Head: Normocephalic and atraumatic.  Cardiovascular: Normal rate and regular rhythm.   Systolic murmur   Pulmonary/Chest: Effort normal and breath sounds normal. No respiratory distress.  Abdominal: Soft. There is no tenderness.  Musculoskeletal: She exhibits no edema or deformity.  Dressings in place, able to move foot/leg spontaneously, intact peripheral pulses    Neurological:  Alert and oriented to self only c/w baseline, interactive   Skin: Skin is warm and dry.    Assessment/Plan:  R Hip Fracture, Osteoporosis  Pt presented with impacted subcapital hip fracture s/p mechanical fall which was repaired with R hip hemiarthroplasty. She had a normal DEXA scan in 2015, however this fracture d/t fall from standing height qualifies her for osteoporosis diagnosis, would benefit from bisphosphonate therapy after acute peri-operative period. Awaiting SNF placement to continue rehab, hopefully will have bed available tomorrow.   --Neurovascular checks --Continue PT/OT   --Pain Control: Tylenol, Oxycodone 5-10 mg, q4hr PRN, Morphine 0.5 mg q2hr PRN   --Continue Lovenox  Dementia At baseline pt is  unable to complete ADLs, is only able to recognize her daughter and is less interactive with other people. Her daughter is primary caregiver. She denies a history of sun-downing or delirium but notes she may be confused if she (daughter) is not present. Currently at baseline mental status  --Continue home donepezil, risperidone --Delirium precautions    Dispo: Anticipated discharge in approximately 1-2 day(s).   Ginger CarneHarden, Jostin Rue, MD 12/18/2016, 12:27 PM Pager: [phone]

## 2016-12-19 LAB — MAGNESIUM: MAGNESIUM: 1.5 mg/dL — AB (ref 1.7–2.4)

## 2016-12-19 LAB — CBC
HCT: 30.8 % — ABNORMAL LOW (ref 36.0–46.0)
Hemoglobin: 9.5 g/dL — ABNORMAL LOW (ref 12.0–15.0)
MCH: 23.8 pg — AB (ref 26.0–34.0)
MCHC: 30.8 g/dL (ref 30.0–36.0)
MCV: 77.2 fL — AB (ref 78.0–100.0)
PLATELETS: 283 10*3/uL (ref 150–400)
RBC: 3.99 MIL/uL (ref 3.87–5.11)
RDW: 15.2 % (ref 11.5–15.5)
WBC: 9 10*3/uL (ref 4.0–10.5)

## 2016-12-19 LAB — BASIC METABOLIC PANEL
Anion gap: 7 (ref 5–15)
BUN: <5 mg/dL — ABNORMAL LOW (ref 6–20)
CALCIUM: 8.5 mg/dL — AB (ref 8.9–10.3)
CO2: 29 mmol/L (ref 22–32)
CREATININE: 0.53 mg/dL (ref 0.44–1.00)
Chloride: 104 mmol/L (ref 101–111)
GFR calc Af Amer: >60 mL/min (ref >60)
Glucose, Bld: 157 mg/dL — ABNORMAL HIGH (ref 65–99)
Potassium: 3.2 mmol/L — ABNORMAL LOW (ref 3.5–5.1)
SODIUM: 140 mmol/L (ref 135–145)

## 2016-12-19 MED ORDER — ENOXAPARIN SODIUM 40 MG/0.4ML ~~LOC~~ SOLN: 40.0000 mg | SUBCUTANEOUS | 0 refills | Status: AC

## 2016-12-19 MED ORDER — ALUM & MAG HYDROXIDE-SIMETH 200-200-20 MG/5ML PO SUSP: 30.0000 mL | ORAL | 0 refills | Status: AC | PRN

## 2016-12-19 NOTE — Progress Notes (Signed)
Pt transported to SNF by [facility]. Pt is not in distress.

## 2016-12-19 NOTE — Progress Notes (Signed)
Physical Therapy Treatment Patient Details Name: Shannon StagersBertha G Risk MRN: [id number] DOB: 08/31/1931 Today's Date: 12/19/2016    History of Present Illness Pt is an 83 y.o. female s/p right hip hemiarthroplasty with direct anterior approach. Pertinent PMH includes dementia, HTN, heart murmur.     PT Comments    Pt pleasantly confused on arrival and unaware of fall, place or time. Pt able to tolerate sitting, standing and pivot to chair but noted discomfort with RLE movement. Pt with slightly increased mobility from last session with SNF still appropriate. Will continue to follow.     Follow Up Recommendations  SNF;Supervision/Assistance - 24 hour     Equipment Recommendations  None recommended by PT    Recommendations for Other Services       Precautions / Restrictions Precautions Precautions: Fall Restrictions Weight Bearing Restrictions: No RLE Weight Bearing: Weight bearing as tolerated    Mobility  Bed Mobility Overal bed mobility: Needs Assistance Bed Mobility: Supine to Sit     Supine to sit: Max assist     General bed mobility comments: assist to pivot to EOB and elevate trunk with increased time and assist to achieve midline posture EOB. Pt at times with posterior lean with scooting to EOB due to fear of falling  Transfers Overall transfer level: Needs assistance   Transfers: Sit to/from Stand;Stand Pivot Transfers Sit to Stand: Mod assist;+2 physical assistance Stand pivot transfers: Max assist;+2 physical assistance       General transfer comment: cues for sequence with assist of belt to stand with assist for anterior translation and rise with assist to control and move pelvis with pt demonstrating very limited shuffling to step to chair. Pt unable to follow commands for hand placement on therapist arm  Ambulation/Gait             General Gait Details: unable   Stairs            Wheelchair Mobility    Modified Rankin (Stroke Patients  Only)       Balance Overall balance assessment: Needs assistance   Sitting balance-Leahy Scale: Fair       Standing balance-Leahy Scale: Poor                              Cognition Arousal/Alertness: Awake/alert Behavior During Therapy: Flat affect Overall Cognitive Status: No family/caregiver present to determine baseline cognitive functioning                                 General Comments: pt able to state name, otherwise not oriented stating she is at home in bed      Exercises General Exercises - Lower Extremity Long Arc Quad: AAROM;Right;Seated;10 reps Hip ABduction/ADduction: AAROM;Right;Seated;10 reps Hip Flexion/Marching: AAROM;Right;Seated;10 reps    General Comments        Pertinent Vitals/Pain Pain Assessment: Faces Faces Pain Scale: Hurts even more Pain Location: Rt hip with motion Pain Intervention(s): Limited activity within patient's tolerance;Repositioned;Monitored during session    Home Living                      Prior Function            PT Goals (current goals can now be found in the care plan section) Progress towards PT goals: Progressing toward goals    Frequency    Min 3X/week  PT Plan Current plan remains appropriate;Frequency needs to be updated    Co-evaluation              AM-PAC PT "6 Clicks" Daily Activity  Outcome Measure  Difficulty turning over in bed (including adjusting bedclothes, sheets and blankets)?: Unable Difficulty moving from lying on back to sitting on the side of the bed? : Unable Difficulty sitting down on and standing up from a chair with arms (e.g., wheelchair, bedside commode, etc,.)?: Unable Help needed moving to and from a bed to chair (including a wheelchair)?: Total Help needed walking in hospital room?: Total Help needed climbing 3-5 steps with a railing? : Total 6 Click Score: 6    End of Session Equipment Utilized During Treatment: Gait  belt Activity Tolerance: Patient tolerated treatment well Patient left: in chair;with chair alarm set;with call bell/phone within reach;with nursing/sitter in room Nurse Communication: Mobility status;Precautions;Weight bearing status PT Visit Diagnosis: Other abnormalities of gait and mobility (R26.89);Difficulty in walking, not elsewhere classified (R26.2);History of falling (Z91.81)     Time: 1610-9604 PT Time Calculation (min) (ACUTE ONLY): 16 min  Charges:  $Therapeutic Activity: 8-22 mins                    G Codes:       Delaney Meigs, PT [phone]   Kavin Weckwerth B Cecillia Menees 12/19/2016, 10:29 AM

## 2016-12-19 NOTE — Progress Notes (Signed)
RN called report to SNF.  

## 2016-12-19 NOTE — Social Work (Addendum)
Clinical Social Worker facilitated patient discharge including contacting patient family and facility to confirm patient discharge plans.  Clinical information faxed to facility and family agreeable with plan.    CSW arranged ambulance transport via [facility] to [facility].    RN to call [phone] to give report prior to discharge. Pt going to Room 201.  Clinical Social Worker will sign off for now as social work intervention is no longer needed. Please consult us again if new need arises.  Keene BreathPatricia Zell Doucette, LCSW Clinical Social Worker [phone]

## 2016-12-19 NOTE — Clinical Social Work Placement (Signed)
   CLINICAL SOCIAL WORK PLACEMENT  NOTE  Date:  12/19/2016  Patient Details  Name: Shannon Bright MRN: [id number] Date of Birth: 01/02/1932  Clinical Social Work is seeking post-discharge placement for this patient at the Skilled  Nursing Facility level of care (*CSW will initial, date and re-position this form in  chart as items are completed):  Yes   Patient/family provided with [place] Clinical Social Work Department's list of facilities offering this level of care within the geographic area requested by the patient (or if unable, by the patient's family).  Yes   Patient/family informed of their freedom to choose among providers that offer the needed level of care, that participate in Medicare, Medicaid or managed care program needed by the patient, have an available bed and are willing to accept the patient.  Yes   Patient/family informed of [facility]'s ownership interest in [facility] and [facility], as well as of the fact that they are under no obligation to receive care at these facilities.  PASRR submitted to EDS on 12/17/16     PASRR number received on 12/17/16     Existing PASRR number confirmed on       FL2 transmitted to all facilities in geographic area requested by pt/family on 12/17/16     FL2 transmitted to all facilities within larger geographic area on       Patient informed that his/her managed care company has contracts with or will negotiate with certain facilities, including the following:        Yes   Patient/family informed of bed offers received.  Patient chooses bed at [facility]     Physician recommends and patient chooses bed at      Patient to be [facility] on 12/19/16.  Patient to be transferred to facility by [facility]     Patient family notified on 12/19/16 of transfer.  Name of family member notified:  cynthia daughter at bedside     PHYSICIAN Please sign FL2, Please prepare priority  discharge summary, including medications, Please prepare prescriptions     Additional Comment:    _______________________________________________ Tresa MoorePatricia V Phu Record, LCSW 12/19/2016, 1:07 PM

## 2016-12-19 NOTE — Social Work (Signed)
CSW confirmed SNF placement at [facility].  CSW spoke with daughter at bedside to confirm.  Pt will transition to SNF today once DC summary is completed.  CSW will continue to follow.  Keene BreathPatricia Mickenzie Stolar, LCSW Clinical Social Worker [phone]

## 2016-12-19 NOTE — Progress Notes (Signed)
Internal Medicine Attending:   I saw and examined the patient. I reviewed the Dr Shanda BumpsAmin's note and I agree with the resident's findings and plan as documented in the resident's note with the following additions: BP remains mildly elevated. She has good follow up care with [facility]/Dr Dorothe PeaKoehler, she has been maintained on home medications here, she may need to escalate therapy.

## 2016-12-19 NOTE — Progress Notes (Signed)
   Subjective: patient was feeling better. She was sitting comfortably on chair. Complaining of mild abdominal discomfort. Denies any hip pain.  Objective:  Vital signs in last 24 hours: Vitals:   12/18/16 0513 12/18/16 1502 12/18/16 1929 12/19/16 0430  BP: (!) 165/57 (!) 148/61 (!) 164/54 (!) 147/51  Pulse: 64 64 70 86  Resp: 16 16 16 16   Temp: 98.7 F (37.1 C) 98.2 F (36.8 C) 98.2 F (36.8 C) 98.1 F (36.7 C)  TempSrc: Axillary Oral Axillary Axillary  SpO2: 99% 98% 98% 100%  Weight:      Height:       Gen. Frail elderly pleasant lady,sitting comfortably in chair, in no acute distress. Chest.clear bilaterally. CV. Regular rate and rhythm. Systolic murmur   Abdomen. Soft, non tender, bowel sounds positive. Musculoskeletal.clean dressing in place, no erythema or edema. No tenderness. Extremities. no edema, no cyanosis, pulses intact and symmetrical.  Assessment/Plan:  R Hip Fracture, Osteoporosis.  No complications since surgery. [facility]ing [facility]. -Consider bisphosphonate therapy after few weeks. -Continue PT/OT. -continue Tylenol if needed for pain, patient did not record any pain meds for past couple of days. -continue Lovenox for 2 weeks.  Dementia At baseline pt is unable to complete ADLs, is only able to recognize her daughter and is less interactive with other people. Her daughter is primary caregiver. She denies a history of sun-downing or delirium but notes she may be confused if she (daughter) is not present. Currently at baseline mental status  --Continue home donepezil, risperidone --Delirium precautions    Dispo: Being discharged today.  Arnetha CourserAmin, Aviella Disbrow, MD 12/19/2016, 12:23 PM Pager: [phone]

## 2016-12-20 ENCOUNTER — Encounter: Payer: Self-pay | Admitting: Internal Medicine

## 2016-12-20 NOTE — Progress Notes (Signed)
    NURSING HOME LOCATION:  [facility] ROOM NUMBER: : [id number]  CODE STATUS:  Full Code  PCP:  System, Pcp Not In    This is a comprehensive admission note to [facility] performed on this date less than 30 days from date of admission. Included are preadmission medical/surgical history;reconciled medication list; family history; social history and comprehensive review of systems.  Corrections and additions to the records were documented . Comprehensive physical exam was also performed. Additionally a clinical summary was entered for each active diagnosis pertinent to this admission in the Problem List to enhance continuity of care.  HPI:  Past medical and surgical history:  Social history:  Family history:  Review of systems:Could not be completed due to dementia. Date given as Constitutional: No fever,significant weight change, fatigue  Eyes: No redness, discharge, pain, vision change ENT/mouth: No nasal congestion,  purulent discharge, earache,change in hearing ,sore throat  Cardiovascular: No chest pain, palpitations,paroxysmal nocturnal dyspnea, claudication, edema  Respiratory: No cough, sputum production,hemoptysis, DOE , significant snoring,apnea  Gastrointestinal: No heartburn,dysphagia,abdominal pain, nausea / vomiting,rectal bleeding, melena,change in bowels Genitourinary: No dysuria,hematuria, pyuria,  incontinence, nocturia Musculoskeletal: No joint stiffness, joint swelling, weakness,pain Dermatologic: No rash, pruritus, change in appearance of skin Neurologic: No dizziness,headache,syncope, seizures, numbness , tingling Psychiatric: No significant anxiety , depression, insomnia, anorexia Endocrine: No change in hair/skin/ nails, excessive thirst, excessive hunger, excessive urination  Hematologic/lymphatic: No significant bruising, lymphadenopathy,abnormal bleeding Allergy/immunology: No itchy/ watery eyes, significant sneezing, urticaria,  angioedema  Physical exam:  Pertinent or positive findings: General appearance:Adequately nourished; no acute distress , increased work of breathing is present.   Lymphatic: No lymphadenopathy about the head, neck, axilla . Eyes: No conjunctival inflammation or lid edema is present. There is no scleral icterus. Ears:  External ear exam shows no significant lesions or deformities.   Nose:  External nasal examination shows no deformity or inflammation. Nasal mucosa are pink and moist without lesions ,exudates Oral exam: lips and gums are healthy appearing.There is no oropharyngeal erythema or exudate . Neck:  No thyromegaly, masses, tenderness noted.    Heart:  Normal rate and regular rhythm. S1 and S2 normal without gallop, murmur, click, rub .  Lungs:Chest clear to auscultation without wheezes, rhonchi,rales , rubs. Abdomen:Bowel sounds are normal. Abdomen is soft and nontender with no organomegaly, hernias,masses. GU: deferred  Extremities:  No cyanosis, clubbing,edema  Neurologic exam : Strength equal  in upper & lower extremities Balance,Rhomberg,finger to nose testing could not be completed due to clinical state Deep tendon reflexes are equal Skin: Warm & dry w/o tenting. No significant lesions or rash.  See clinical summary under each active problem in the Problem List with associated updated therapeutic plan   This encounter was created in error - please disregard.

## 2016-12-29 ENCOUNTER — Ambulatory Visit (INDEPENDENT_AMBULATORY_CARE_PROVIDER_SITE_OTHER): Payer: Medicare (Managed Care)

## 2016-12-29 ENCOUNTER — Encounter (INDEPENDENT_AMBULATORY_CARE_PROVIDER_SITE_OTHER): Payer: Self-pay | Admitting: Orthopaedic Surgery

## 2016-12-29 ENCOUNTER — Ambulatory Visit (INDEPENDENT_AMBULATORY_CARE_PROVIDER_SITE_OTHER): Payer: Medicare (Managed Care) | Admitting: Orthopaedic Surgery

## 2016-12-29 DIAGNOSIS — S72001A Fracture of unspecified part of neck of right femur, initial encounter for closed fracture: Secondary | ICD-10-CM

## 2016-12-29 NOTE — Progress Notes (Signed)
Patient is 2 weeks status post right partial hip replacement for femoral neck fracture she is at a skilled nursing facility. She is overall doing well. She does have advanced dementia.   surgical incision is healed without signs of infection. Leg lengths are equal. X-ray show stable alignment of the partial hip replacement   she is doing well from my standpoint. I would lie her to continue with physical therapy. See her back in 4 weeks with repeat standing AP pelvis as possible.

## 2017-01-02 ENCOUNTER — Telehealth (INDEPENDENT_AMBULATORY_CARE_PROVIDER_SITE_OTHER): Payer: Self-pay | Admitting: Orthopaedic Surgery

## 2017-01-02 NOTE — Telephone Encounter (Signed)
12/29/2016 OV NOTE FAXED TO [facility] [phone]

## 2017-01-26 ENCOUNTER — Ambulatory Visit (INDEPENDENT_AMBULATORY_CARE_PROVIDER_SITE_OTHER): Payer: Medicare (Managed Care) | Admitting: Orthopaedic Surgery

## 2017-01-26 ENCOUNTER — Encounter (INDEPENDENT_AMBULATORY_CARE_PROVIDER_SITE_OTHER): Payer: Self-pay | Admitting: Orthopaedic Surgery

## 2017-01-26 ENCOUNTER — Ambulatory Visit (INDEPENDENT_AMBULATORY_CARE_PROVIDER_SITE_OTHER): Payer: Medicare (Managed Care)

## 2017-01-26 DIAGNOSIS — S72001A Fracture of unspecified part of neck of right femur, initial encounter for closed fracture: Secondary | ICD-10-CM | POA: Diagnosis not present

## 2017-01-26 NOTE — Progress Notes (Signed)
Patient is 6 weeks status post right hip hemiarthroplasty for femoral neck fracture. She is currently at a skilled nursing facility. She has advanced dementia. She is not progressing as well with physical therapy secondary to the dementia. She does not seem to be in much pain.  Physical exam is benign. Fully healed surgical scar. Leg lengths are equal. X-ray show stable partial hip replacement in good alignment.  Patient is stable from orthopedic standpoint. I will like to check back with her in 2 more months with repeat AP pelvis. Anticipate releasing her at that time.

## 2017-02-13 ENCOUNTER — Ambulatory Visit (INDEPENDENT_AMBULATORY_CARE_PROVIDER_SITE_OTHER): Payer: Medicare (Managed Care) | Admitting: Orthopaedic Surgery

## 2017-03-08 ENCOUNTER — Telehealth (INDEPENDENT_AMBULATORY_CARE_PROVIDER_SITE_OTHER): Payer: Self-pay | Admitting: Orthopaedic Surgery

## 2017-03-08 NOTE — Telephone Encounter (Signed)
Spoke with Lamar LaundrySonya with [facility] she advised patient is deceased. The number to contact Lamar LaundrySonya is [phone]

## 2017-03-09 NOTE — Telephone Encounter (Signed)
FYI

## 2017-03-11 DEATH — deceased

## 2017-03-28 ENCOUNTER — Ambulatory Visit (INDEPENDENT_AMBULATORY_CARE_PROVIDER_SITE_OTHER): Payer: Medicare (Managed Care) | Admitting: Orthopaedic Surgery

## 2017-09-18 ENCOUNTER — Encounter: Payer: Self-pay | Admitting: Gastroenterology

## 2019-02-11 IMAGING — DX DG CHEST 1V PORT
1 series · 1 of 1 positions shown · non-contrast
Comparison: 03/11/2013

CLINICAL DATA: Preop for hip fracture.  Dementia and hypertension.

EXAM:
PORTABLE CHEST 1 VIEW

[chest ap]
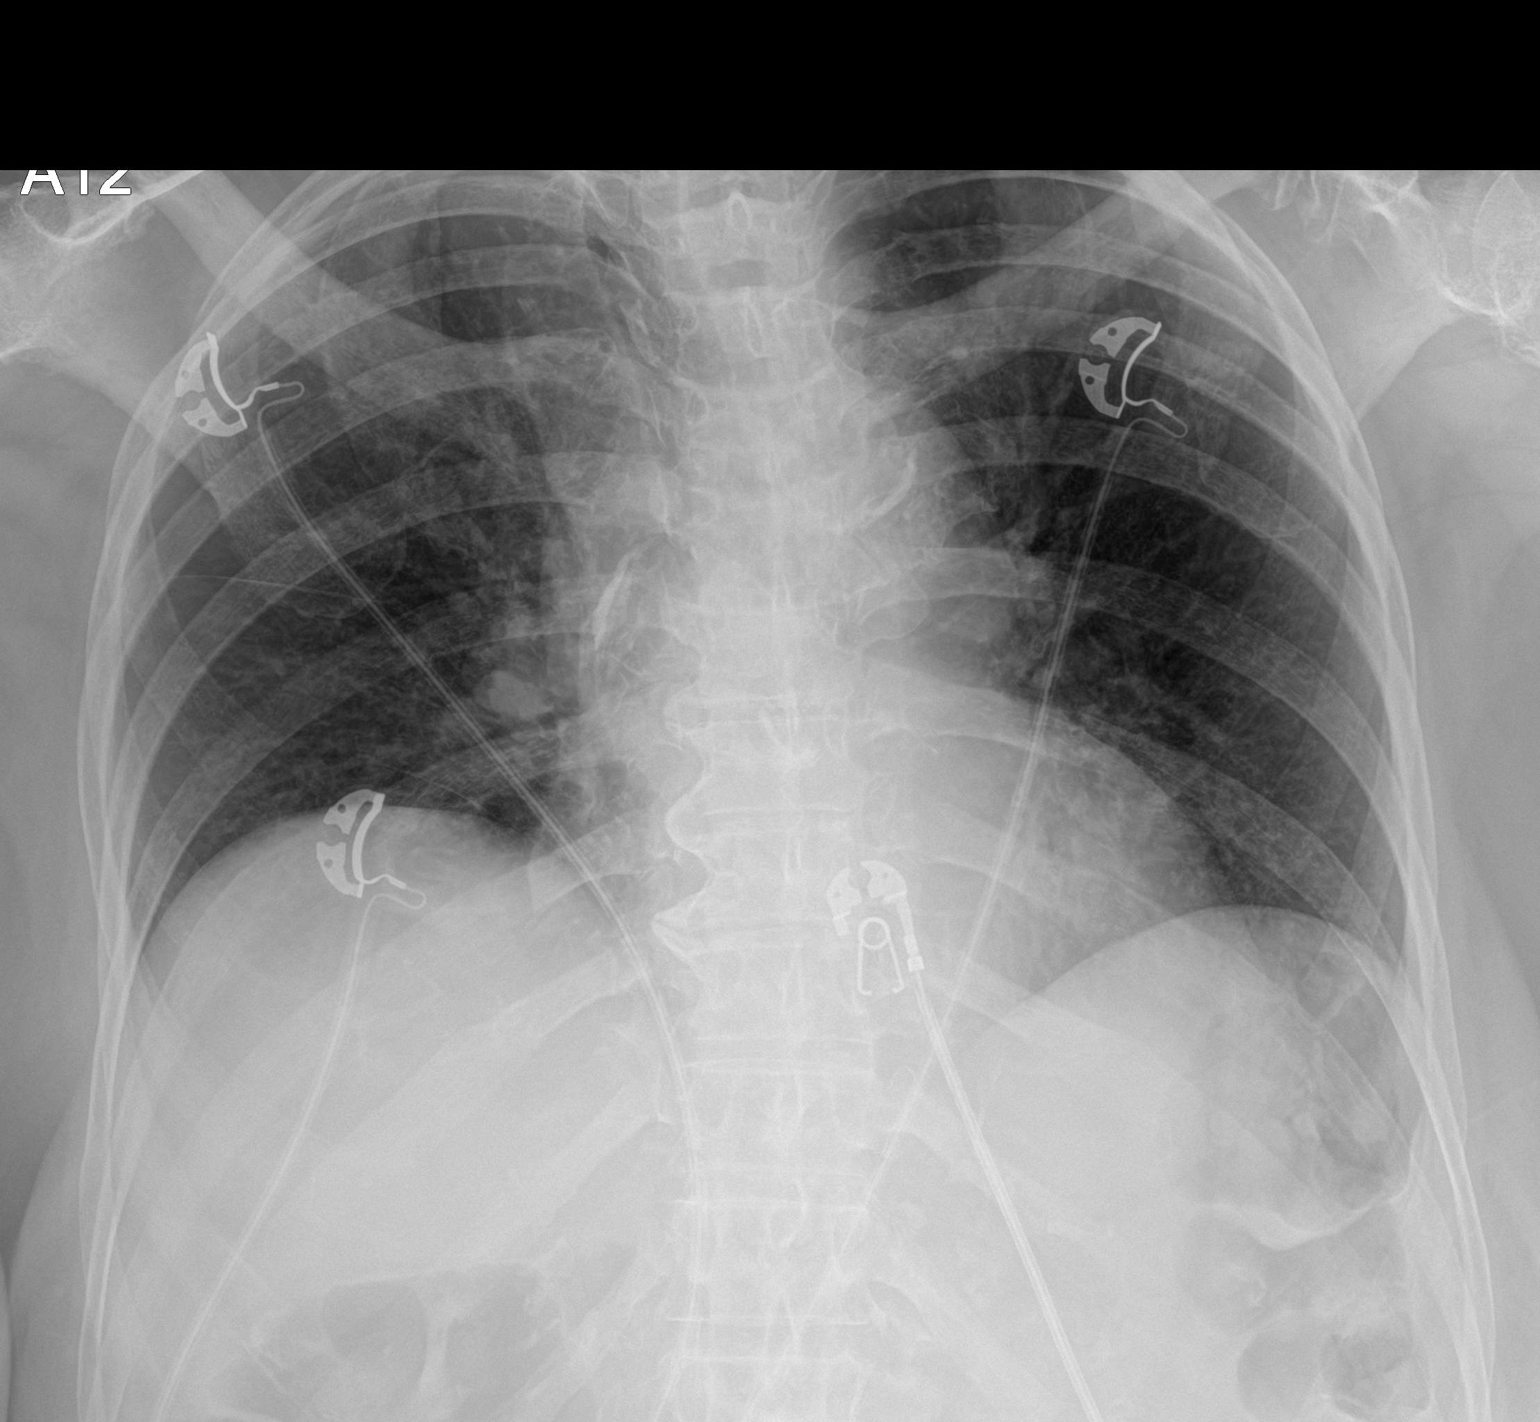

[1 of 1 positions shown; findings below may reference images not displayed]

FINDINGS: Mild to moderate right hemidiaphragm elevation. Patient rotated
right. Normal heart size for level of inspiration. No pleural
effusion or pneumothorax. Atherosclerosis in the transverse aorta.
No congestive failure. Clear lungs.
IMPRESSION: Low lung volumes, without acute disease.

Aortic Atherosclerosis ([id number].R).

## 2019-02-11 IMAGING — CR DG HIP (WITH OR WITHOUT PELVIS) 2-3V*R*
2 series · 2 of 2 positions shown · non-contrast
Comparison: None.

CLINICAL DATA: Fell 12/12/2016.  Persistent pain.

EXAM:
DG HIP (WITH OR WITHOUT PELVIS) 2-3V RIGHT

[t hip ap right]
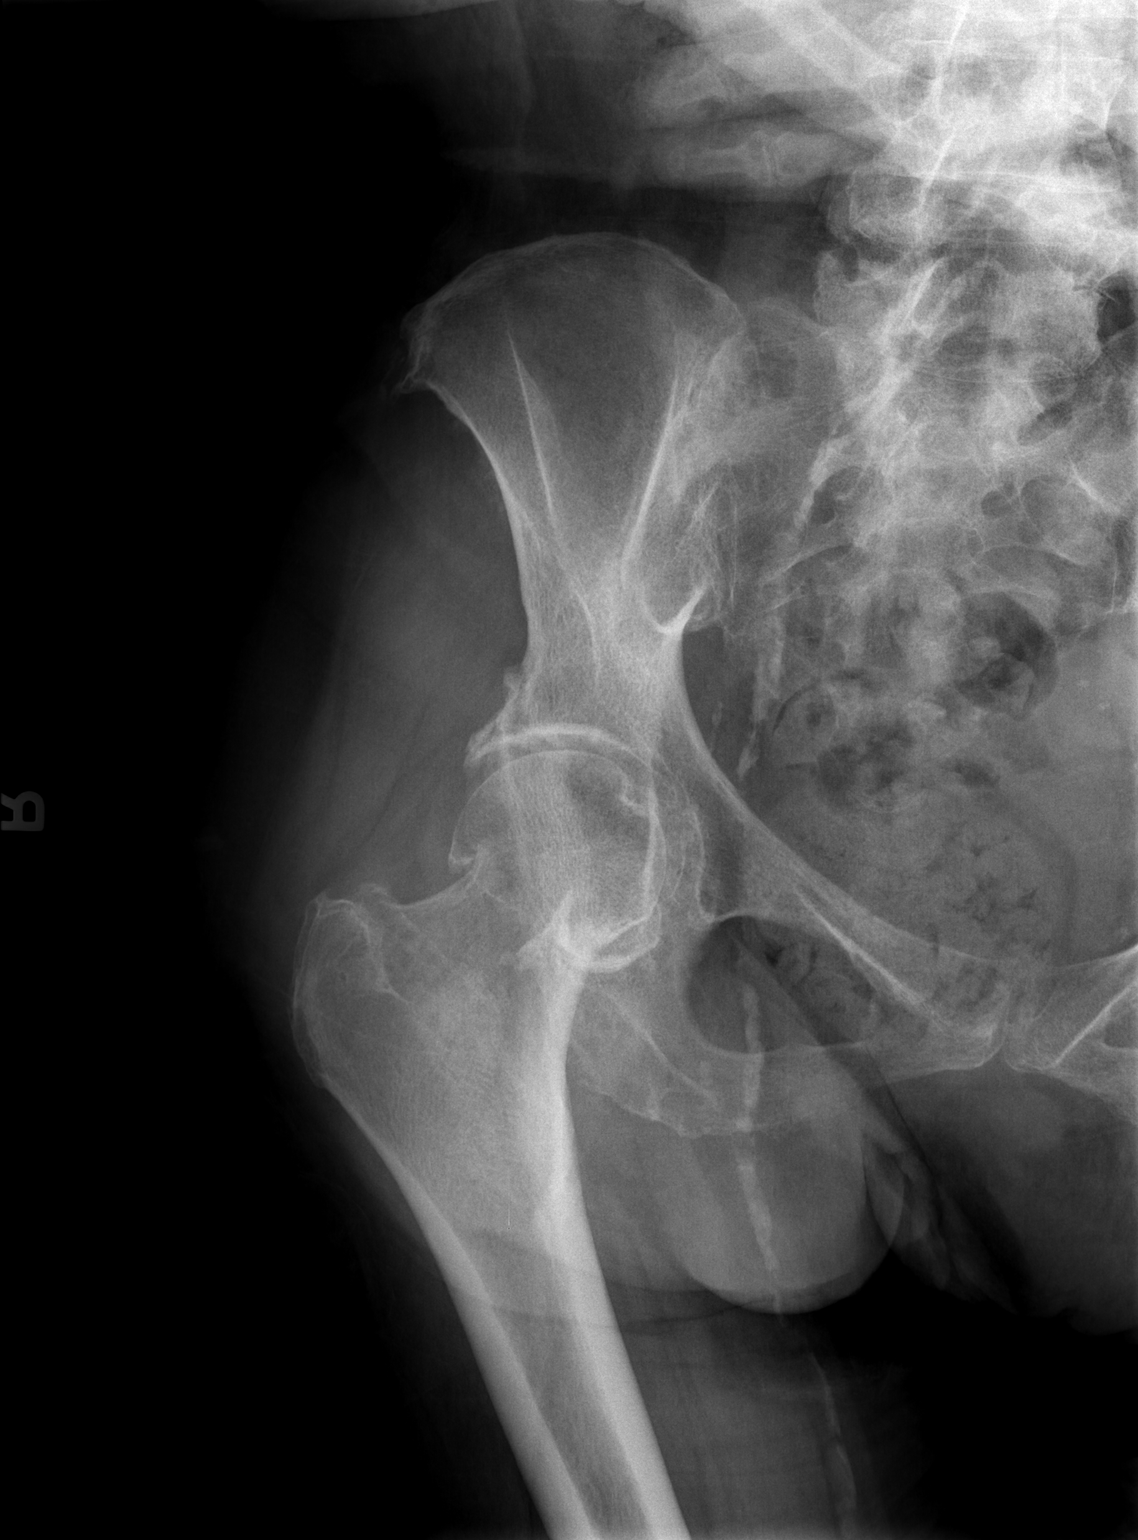

[t hip frog leg right]
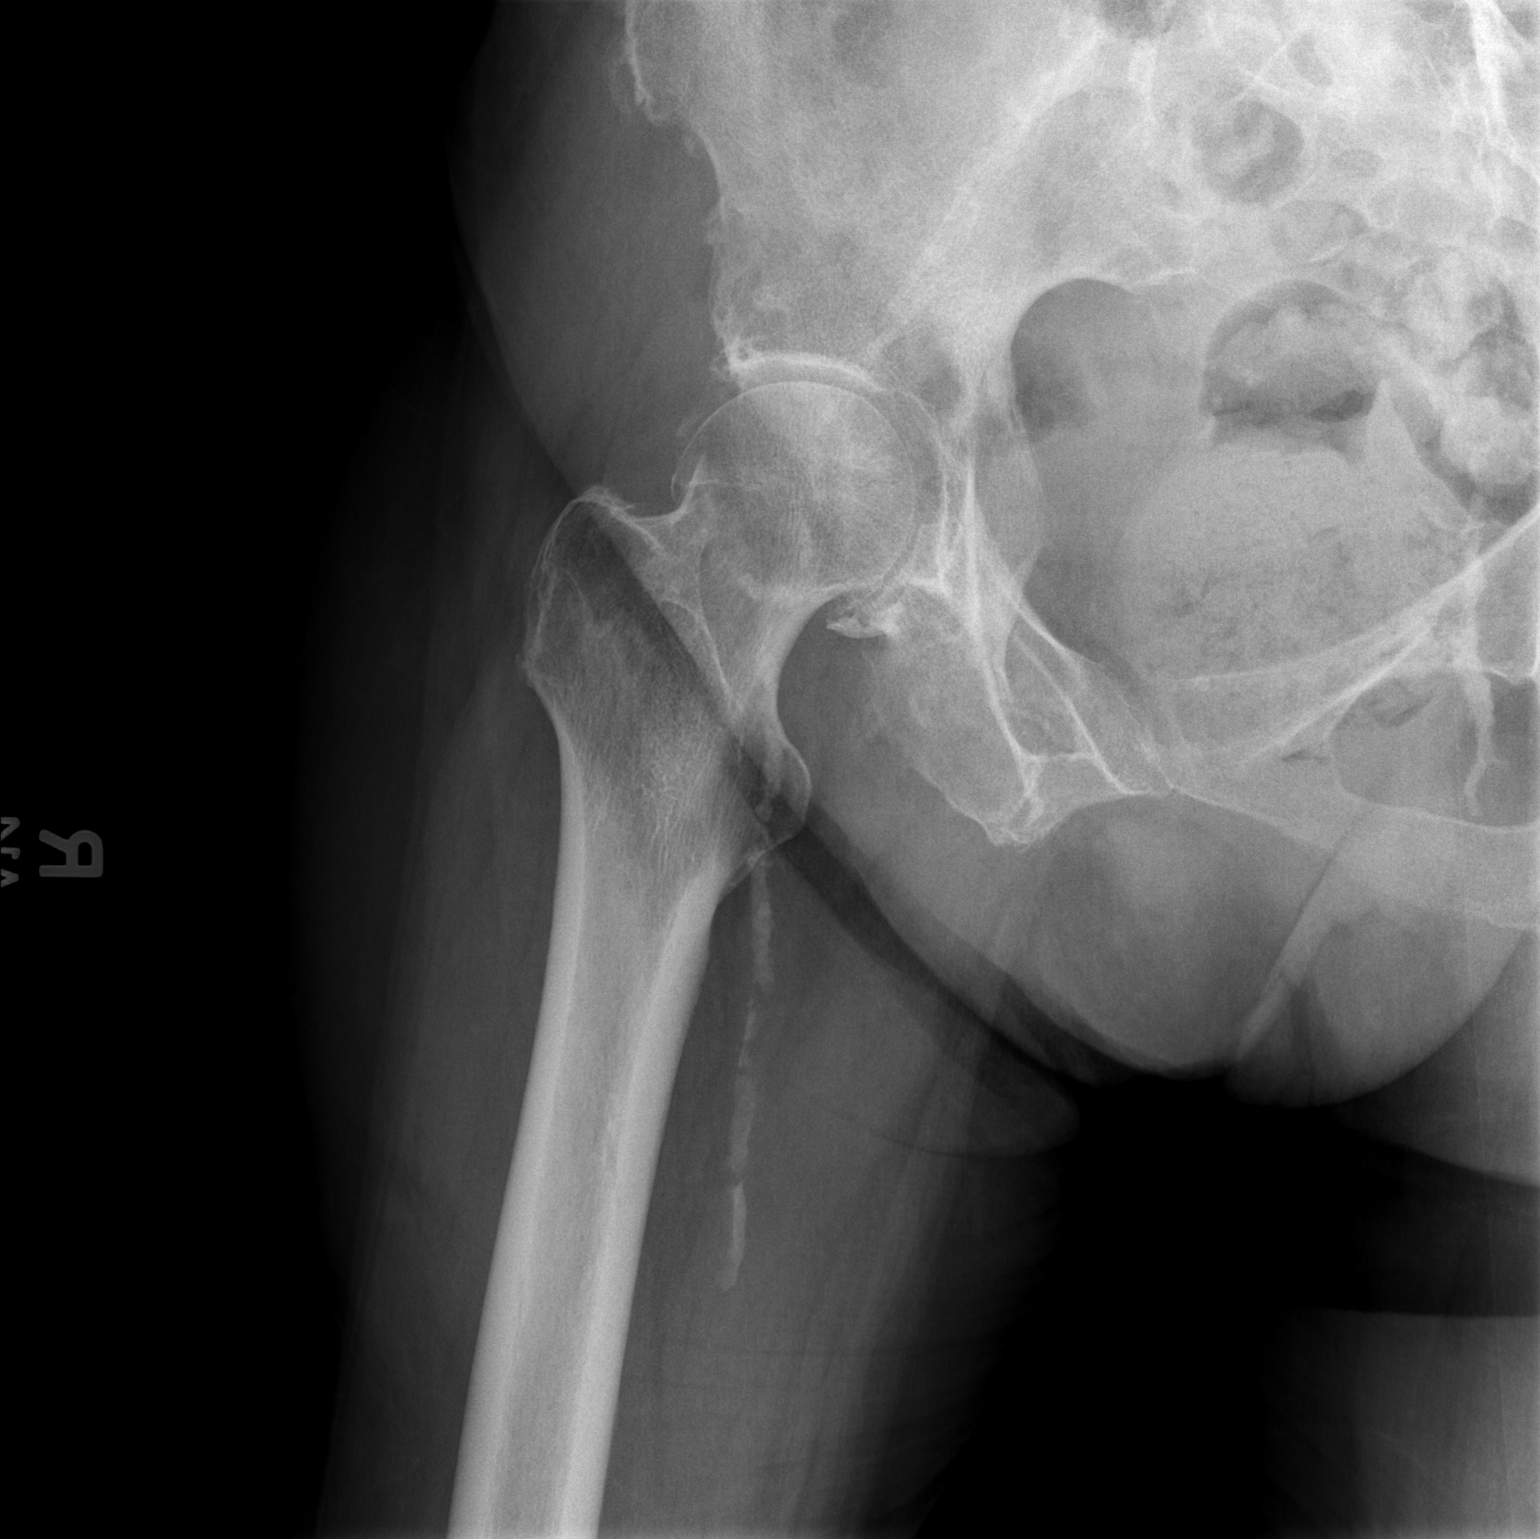

[2 of 2 positions shown; findings below may reference images not displayed]

FINDINGS: Impacted subcapital fracture of the right hip. Moderate right hip
joint degenerative changes. The visualized right hemipelvis is
intact. The pubic symphysis and SI joints are intact. Extensive
vascular calcifications are noted.
IMPRESSION: Impacted subcapital fracture of the right hip.

## 2019-02-13 IMAGING — CR DG PORTABLE PELVIS
1 series · 1 of 1 positions shown · non-contrast
Comparison: 12/13/2016

CLINICAL DATA: Status post right hip replacement

EXAM:
PORTABLE PELVIS 1-2 VIEWS

[AP]
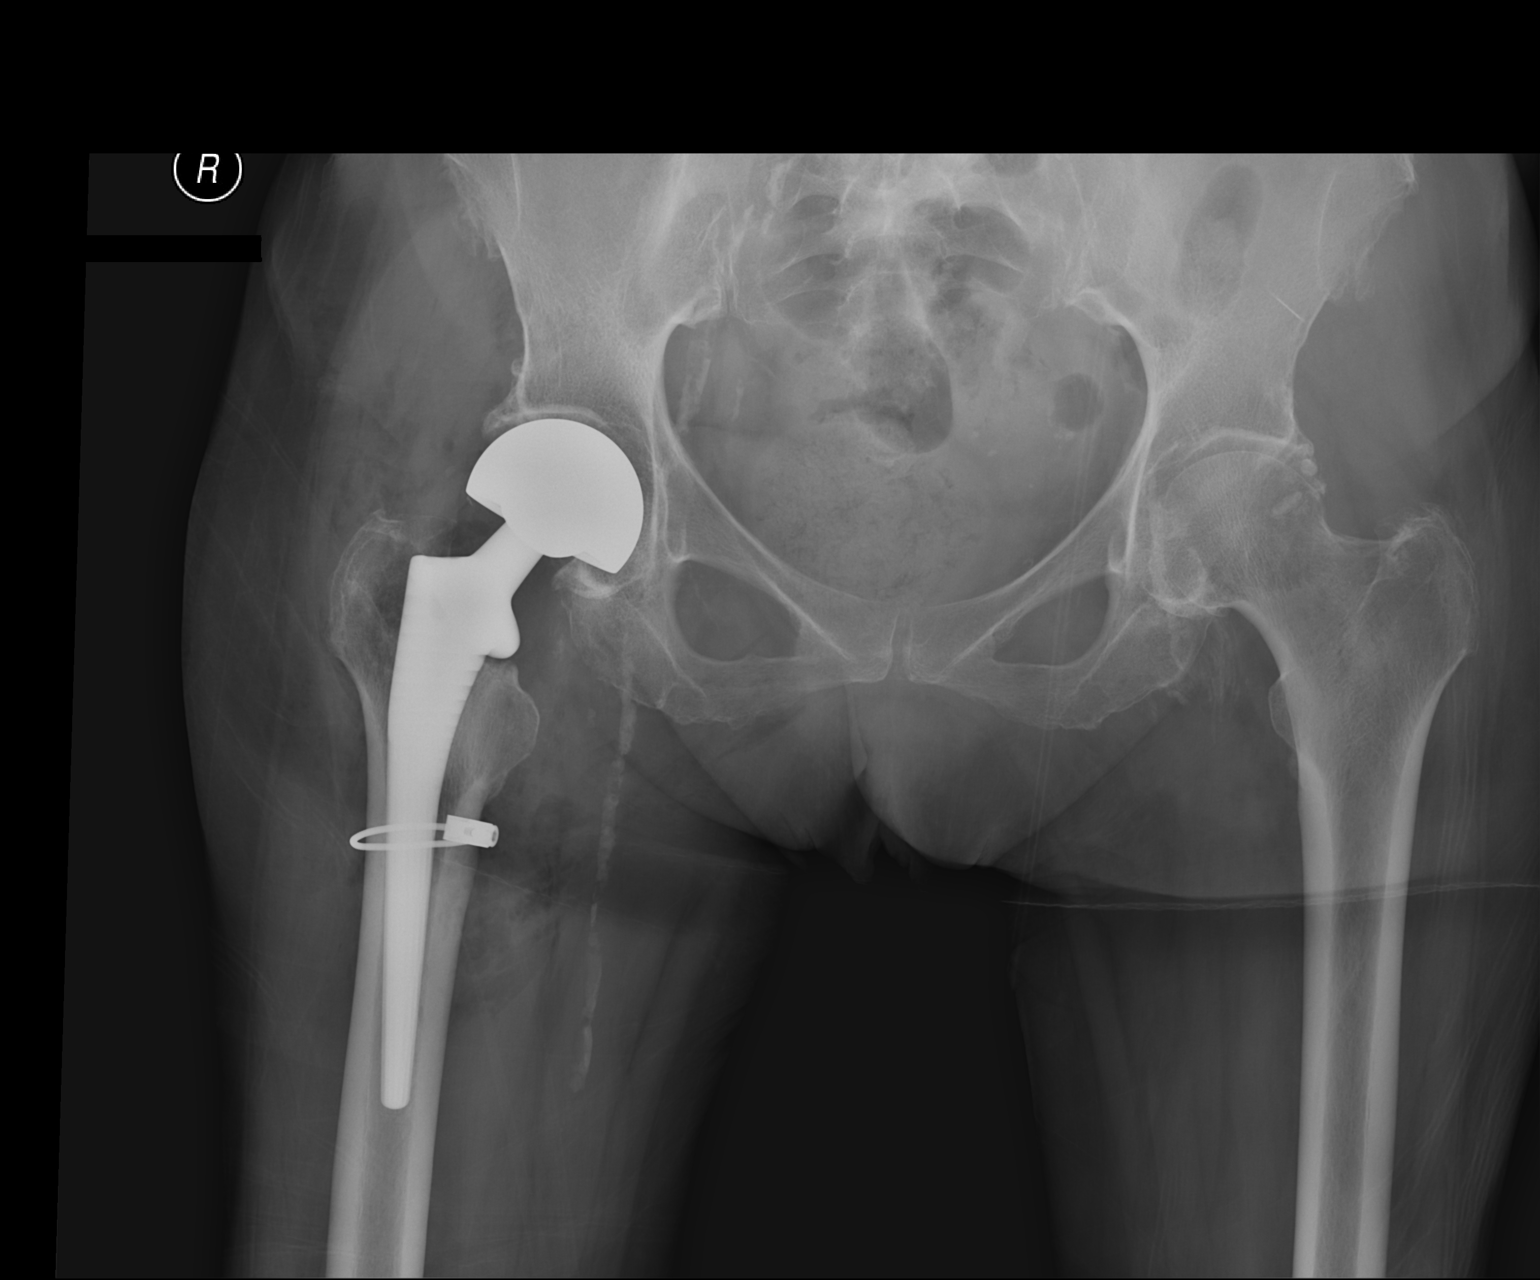

[1 of 1 positions shown; findings below may reference images not displayed]

FINDINGS: Interval right hip replacement with normal alignment. No acute
fracture seen. Dense vascular calcification. Small amount of soft
tissue gas. Mild to moderate degenerative changes of the left hip
IMPRESSION: Status post right hip arthroplasty with expected postsurgical change
